# Patient Record
Sex: Male | Born: 1950 | Race: Black or African American | Hispanic: No | Marital: Single | Smoking: Former smoker
Health system: Southern US, Community
[De-identification: ages and names within clinical notes are randomized; demographics above are authoritative.]

## PROBLEM LIST (undated history)

## (undated) DIAGNOSIS — I10 Essential (primary) hypertension: Secondary | ICD-10-CM

## (undated) DIAGNOSIS — C801 Malignant (primary) neoplasm, unspecified: Secondary | ICD-10-CM

## (undated) HISTORY — PX: NO PAST SURGERIES: SHX2092

---

## 2005-11-10 ENCOUNTER — Emergency Department: Payer: Self-pay | Admitting: Emergency Medicine

## 2006-10-15 ENCOUNTER — Emergency Department: Payer: Self-pay | Admitting: General Practice

## 2008-05-30 ENCOUNTER — Emergency Department: Payer: Self-pay | Admitting: Emergency Medicine

## 2010-06-29 ENCOUNTER — Emergency Department: Payer: Self-pay | Admitting: Emergency Medicine

## 2012-03-19 ENCOUNTER — Emergency Department: Payer: Self-pay | Admitting: Emergency Medicine

## 2013-07-31 ENCOUNTER — Emergency Department: Payer: Self-pay | Admitting: Emergency Medicine

## 2013-07-31 LAB — CBC WITH DIFFERENTIAL/PLATELET
Basophil %: 0.8 %
MCH: 32.8 pg (ref 26.0–34.0)
MCHC: 34.4 g/dL (ref 32.0–36.0)
MCV: 95 fL (ref 80–100)
Monocyte #: 0.1 x10 3/mm — ABNORMAL LOW (ref 0.2–1.0)
Neutrophil #: 6.4 10*3/uL (ref 1.4–6.5)
RBC: 4.39 10*6/uL — ABNORMAL LOW (ref 4.40–5.90)
RDW: 13.6 % (ref 11.5–14.5)
WBC: 7.3 10*3/uL (ref 3.8–10.6)

## 2013-07-31 LAB — COMPREHENSIVE METABOLIC PANEL
Albumin: 4.5 g/dL (ref 3.4–5.0)
Anion Gap: 4 — ABNORMAL LOW (ref 7–16)
BUN: 17 mg/dL (ref 7–18)
Calcium, Total: 9.6 mg/dL (ref 8.5–10.1)
Chloride: 104 mmol/L (ref 98–107)
Creatinine: 0.95 mg/dL (ref 0.60–1.30)
EGFR (Non-African Amer.): >60
Osmolality: 273 (ref 275–301)
SGOT(AST): 35 U/L (ref 15–37)
SGPT (ALT): 30 U/L (ref 12–78)
Total Protein: 8.1 g/dL (ref 6.4–8.2)

## 2013-07-31 LAB — PROTIME-INR: INR: 1

## 2013-08-04 ENCOUNTER — Emergency Department: Payer: Self-pay | Admitting: Emergency Medicine

## 2014-07-21 ENCOUNTER — Emergency Department: Payer: Self-pay | Admitting: Emergency Medicine

## 2015-08-31 ENCOUNTER — Encounter: Payer: Self-pay | Admitting: Emergency Medicine

## 2015-08-31 ENCOUNTER — Emergency Department: Payer: Self-pay

## 2015-08-31 ENCOUNTER — Emergency Department
Admission: EM | Admit: 2015-08-31 | Discharge: 2015-08-31 | Disposition: A | Discharge status: Home / Self Care | Payer: Self-pay | Attending: Emergency Medicine | Admitting: Emergency Medicine

## 2015-08-31 DIAGNOSIS — I10 Essential (primary) hypertension: Secondary | ICD-10-CM | POA: Insufficient documentation

## 2015-08-31 DIAGNOSIS — Z720 Tobacco use: Secondary | ICD-10-CM | POA: Insufficient documentation

## 2015-08-31 DIAGNOSIS — R319 Hematuria, unspecified: Secondary | ICD-10-CM | POA: Insufficient documentation

## 2015-08-31 DIAGNOSIS — R109 Unspecified abdominal pain: Secondary | ICD-10-CM | POA: Insufficient documentation

## 2015-08-31 HISTORY — DX: Essential (primary) hypertension: I10

## 2015-08-31 LAB — CBC
HCT: 40.7 % (ref 40.0–52.0)
HEMOGLOBIN: 13.9 g/dL (ref 13.0–18.0)
MCH: 32.4 pg (ref 26.0–34.0)
MCHC: 34.2 g/dL (ref 32.0–36.0)
MCV: 94.7 fL (ref 80.0–100.0)
Platelets: 280 10*3/uL (ref 150–440)
RBC: 4.3 MIL/uL — ABNORMAL LOW (ref 4.40–5.90)
RDW: 14.1 % (ref 11.5–14.5)
WBC: 5.4 10*3/uL (ref 3.8–10.6)

## 2015-08-31 LAB — URINALYSIS COMPLETE WITH MICROSCOPIC (ARMC ONLY)
BACTERIA UA: NONE SEEN
Bilirubin Urine: NEGATIVE
Glucose, UA: NEGATIVE mg/dL
Leukocytes, UA: NEGATIVE
NITRITE: NEGATIVE
Protein, ur: NEGATIVE mg/dL
SPECIFIC GRAVITY, URINE: 1.023 (ref 1.005–1.030)
Squamous Epithelial / LPF: NONE SEEN
pH: 5 (ref 5.0–8.0)

## 2015-08-31 LAB — BASIC METABOLIC PANEL
ANION GAP: 9 (ref 5–15)
BUN: 16 mg/dL (ref 6–20)
CHLORIDE: 108 mmol/L (ref 101–111)
CO2: 24 mmol/L (ref 22–32)
Calcium: 9.3 mg/dL (ref 8.9–10.3)
Creatinine, Ser: 0.93 mg/dL (ref 0.61–1.24)
GFR calc Af Amer: >60 mL/min (ref >60)
GFR calc non Af Amer: >60 mL/min (ref >60)
Glucose, Bld: 112 mg/dL — ABNORMAL HIGH (ref 65–99)
Potassium: 3.6 mmol/L (ref 3.5–5.1)
Sodium: 141 mmol/L (ref 135–145)

## 2015-08-31 LAB — LIPASE, BLOOD: LIPASE: 42 U/L (ref 11–51)

## 2015-08-31 NOTE — ED Provider Notes (Signed)
[facility] Emergency Department Provider Note  ____________________________________________  Time seen: On arrival  I have reviewed the triage vital signs and the nursing notes.   HISTORY  Chief Complaint Flank Pain    HPI Willie Rivas is a 64 y.o. male who presents with complaints of cramping right flank pain for approximately 2 days. He states the pain seems to be intermittent but is at times moderate. Currently it is mild. He denies dysuria. No fevers or chills. No penile discharge. No testicular pain. No history of the same     Past Medical History  Diagnosis Date  . Hypertension     There are no active problems to display for this patient.   History reviewed. No pertinent past surgical history.  No current outpatient prescriptions on file.  Allergies Review of patient's allergies indicates no known allergies.  No family history on file.  Social History Social History  Substance Use Topics  . Smoking status: Current Every Day Smoker  . Smokeless tobacco: None  . Alcohol Use: Yes    Review of Systems  Constitutional: Negative for fever. Eyes: Negative for visual changes. ENT: Negative for sore throat Cardiovascular: Negative for chest pain. Respiratory: Negative for shortness of breath. Gastrointestinal: Negative for abdominal pain, vomiting and diarrhea. Positive for flank pain Genitourinary: Negative for dysuria. Musculoskeletal: Negative for back pain. Skin: Negative for rash. Neurological: Negative for headaches or focal weakness Psychiatric: No anxiety    ____________________________________________   PHYSICAL EXAM:  VITAL SIGNS: ED Triage Vitals  Enc Vitals Group     BP 08/31/15 1757 196/112 mmHg     Pulse Rate 08/31/15 1757 79     Resp 08/31/15 1757 18     Temp 08/31/15 1757 98.4 F (36.9 C)     Temp Source 08/31/15 1757 Oral     SpO2 08/31/15 1757 96 %     Weight 08/31/15 1757 165 lb (74.844 kg)   Height 08/31/15 1757 5\' 6"  (1.676 m)     Head Cir --      Peak Flow --      Pain Score 08/31/15 1758 3     Pain Loc --      Pain Edu? --      Excl. in [place]? --      Constitutional: Alert and oriented. Well appearing and in no distress. Eyes: Conjunctivae are normal.  ENT   Head: Normocephalic and atraumatic.   Mouth/Throat: Mucous membranes are moist. Cardiovascular: Normal rate, regular rhythm. Normal and symmetric distal pulses are present in all extremities. No murmurs, rubs, or gallops. Respiratory: Normal respiratory effort without tachypnea nor retractions. Breath sounds are clear and equal bilaterally.  Gastrointestinal: Soft and non-tender in all quadrants. No distention. There is no CVA tenderness. Genitourinary: deferred Musculoskeletal: Nontender with normal range of motion in all extremities. No lower extremity tenderness nor edema. Neurologic:  Normal speech and language. No gross focal neurologic deficits are appreciated. Skin:  Skin is warm, dry and intact. No rash noted. Psychiatric: Mood and affect are normal. Patient exhibits appropriate insight and judgment.  ____________________________________________    LABS (pertinent positives/negatives)  Labs Reviewed  URINALYSIS COMPLETEWITH MICROSCOPIC ([place] ONLY) - Abnormal; Notable for the following:    Color, Urine YELLOW (*)    APPearance CLEAR (*)    Ketones, ur TRACE (*)    Hgb urine dipstick 1+ (*)    All other components within normal limits  BASIC METABOLIC PANEL - Abnormal; Notable for the following:  Glucose, Bld 112 (*)    All other components within normal limits  CBC - Abnormal; Notable for the following:    RBC 4.30 (*)    All other components within normal limits  LIPASE, BLOOD    ____________________________________________   EKG  None  ____________________________________________    RADIOLOGY I have personally reviewed any xrays that were ordered on this patient: CT renal  stone study shows no abnormalities  ____________________________________________   PROCEDURES  Procedure(s) performed: none  Critical Care performed: none  ____________________________________________   INITIAL IMPRESSION / ASSESSMENT AND PLAN / ED COURSE  Pertinent labs & imaging results that were available during my care of the patient were reviewed by me and considered in my medical decision making (see chart for details).  Patient overall well-appearing and quite comfortable. We will check urine and labs to evaluate for possible kidney stone versus UTI versus other pathology  Urinalysis shows blood in the urine, I will obtain CT renal stone study to evaluate for possible stone.  ----------------------------------------- 7:49 PM on 08/31/2015 -----------------------------------------  CT negative. I discussed with the patient that he has small amount of blood in his urine requires further follow-up. He knows to return if his pain worsens or if he has any concerns ____________________________________________   FINAL CLINICAL IMPRESSION(S) / ED DIAGNOSES  Final diagnoses:  Flank pain, acute     Lavonia Drafts, MD 08/31/15 1950

## 2015-08-31 NOTE — ED Notes (Signed)
C/o right flank pain x 2 days, denies any urinary symptoms or n,v

## 2015-08-31 NOTE — Discharge Instructions (Signed)
Hematuria, Adult Hematuria is blood in your urine. It can be caused by a bladder infection, kidney infection, prostate infection, kidney stone, or cancer of your urinary tract. Infections can usually be treated with medicine, and a kidney stone usually will pass through your urine. If neither of these is the cause of your hematuria, further workup to find out the reason may be needed. It is very important that you tell your health care provider about any blood you see in your urine, even if the blood stops without treatment or happens without causing pain. Blood in your urine that happens and then stops and then happens again can be a symptom of a very serious condition. Also, pain is not a symptom in the initial stages of many urinary cancers. HOME CARE INSTRUCTIONS   Drink lots of fluid, 3-4 quarts a day. If you have been diagnosed with an infection, cranberry juice is especially recommended, in addition to large amounts of water.  Avoid caffeine, tea, and carbonated beverages because they tend to irritate the bladder.  Avoid alcohol because it may irritate the prostate.  Take all medicines as directed by your health care provider.  If you were prescribed an antibiotic medicine, finish it all even if you start to feel better.  If you have been diagnosed with a kidney stone, follow your health care provider's instructions regarding straining your urine to catch the stone.  Empty your bladder often. Avoid holding urine for long periods of time.  After a bowel movement, women should cleanse front to back. Use each tissue only once.  Empty your bladder before and after sexual intercourse if you are a male. SEEK MEDICAL CARE IF:  You develop back pain.  You have a fever.  You have a feeling of sickness in your stomach (nausea) or vomiting.  Your symptoms are not better in 3 days. Return sooner if you are getting worse. SEEK IMMEDIATE MEDICAL CARE IF:   You develop severe vomiting and  are unable to keep the medicine down.  You develop severe back or abdominal pain despite taking your medicines.  You begin passing a large amount of blood or clots in your urine.  You feel extremely weak or faint, or you pass out. MAKE SURE YOU:   Understand these instructions.  Will watch your condition.  Will get help right away if you are not doing well or get worse.   This information is not intended to replace advice given to you by your health care provider. Make sure you discuss any questions you have with your health care provider.   Document Released: 10/18/2005 Document Revised: 11/08/2014 Document Reviewed: 06/18/2013 Elsevier Interactive Patient Education 2016 Elsevier Inc[employer]  Flank Pain Flank pain refers to pain that is located on the side of the body between the upper abdomen and the back. The pain may occur over a short period of time (acute) or may be long-term or reoccurring (chronic). It may be mild or severe. Flank pain can be caused by many things. CAUSES  Some of the more common causes of flank pain include:  Muscle strains.   Muscle spasms.   A disease of your spine (vertebral disk disease).   A lung infection (pneumonia).   Fluid around your lungs (pulmonary edema).   A kidney infection.   Kidney stones.   A very painful skin rash caused by the chickenpox virus (shingles).   Gallbladder disease.  Advance care will depend on the cause of your  pain. In general,  Rest as directed by your caregiver.  Drink enough fluids to keep your urine clear or pale yellow.  Only take over-the-counter or prescription medicines as directed by your caregiver. Some medicines may help relieve the pain.  Tell your caregiver about any changes in your pain.  Follow up with your caregiver as directed. SEEK IMMEDIATE MEDICAL CARE IF:   Your pain is not controlled with medicine.   You have new or worsening symptoms.  Your pain  increases.   You have abdominal pain.   You have shortness of breath.   You have persistent nausea or vomiting.   You have swelling in your abdomen.   You feel faint or pass out.   You have blood in your urine.  You have a fever or persistent symptoms for more than 2-3 days.  You have a fever and your symptoms suddenly get worse. MAKE SURE YOU:   Understand these instructions.  Will watch your condition.  Will get help right away if you are not doing well or get worse.   This information is not intended to replace advice given to you by your health care provider. Make sure you discuss any questions you have with your health care provider.   Document Released: 12/09/2005 Document Revised: 07/12/2012 Document Reviewed: 06/01/2012 Elsevier Interactive Patient Education [employer].

## 2015-09-06 ENCOUNTER — Emergency Department
Admission: EM | Admit: 2015-09-06 | Discharge: 2015-09-07 | Disposition: A | Discharge status: Home / Self Care | Payer: Self-pay | Attending: Emergency Medicine | Admitting: Emergency Medicine

## 2015-09-06 ENCOUNTER — Encounter: Payer: Self-pay | Admitting: Emergency Medicine

## 2015-09-06 DIAGNOSIS — I10 Essential (primary) hypertension: Secondary | ICD-10-CM | POA: Insufficient documentation

## 2015-09-06 DIAGNOSIS — Z720 Tobacco use: Secondary | ICD-10-CM | POA: Insufficient documentation

## 2015-09-06 DIAGNOSIS — R109 Unspecified abdominal pain: Secondary | ICD-10-CM | POA: Insufficient documentation

## 2015-09-06 DIAGNOSIS — R319 Hematuria, unspecified: Secondary | ICD-10-CM | POA: Insufficient documentation

## 2015-09-06 LAB — CBC
HCT: 41.7 % (ref 40.0–52.0)
Hemoglobin: 13.8 g/dL (ref 13.0–18.0)
MCH: 31.7 pg (ref 26.0–34.0)
MCHC: 33 g/dL (ref 32.0–36.0)
MCV: 95.9 fL (ref 80.0–100.0)
PLATELETS: 278 10*3/uL (ref 150–440)
RBC: 4.34 MIL/uL — AB (ref 4.40–5.90)
RDW: 14 % (ref 11.5–14.5)
WBC: 6.6 10*3/uL (ref 3.8–10.6)

## 2015-09-06 LAB — URINALYSIS COMPLETE WITH MICROSCOPIC (ARMC ONLY)
BACTERIA UA: NONE SEEN
Bilirubin Urine: NEGATIVE
GLUCOSE, UA: NEGATIVE mg/dL
Ketones, ur: NEGATIVE mg/dL
Leukocytes, UA: NEGATIVE
Nitrite: NEGATIVE
PROTEIN: NEGATIVE mg/dL
Specific Gravity, Urine: 1.027 (ref 1.005–1.030)
pH: 5 (ref 5.0–8.0)

## 2015-09-06 LAB — BASIC METABOLIC PANEL
Anion gap: 4 — ABNORMAL LOW (ref 5–15)
BUN: 14 mg/dL (ref 6–20)
CHLORIDE: 106 mmol/L (ref 101–111)
CO2: 30 mmol/L (ref 22–32)
CREATININE: 1.03 mg/dL (ref 0.61–1.24)
Calcium: 9.7 mg/dL (ref 8.9–10.3)
Glucose, Bld: 121 mg/dL — ABNORMAL HIGH (ref 65–99)
POTASSIUM: 4 mmol/L (ref 3.5–5.1)
SODIUM: 140 mmol/L (ref 135–145)

## 2015-09-06 MED ORDER — SODIUM CHLORIDE 0.9 % IV BOLUS (SEPSIS): 500.0000 mL | INTRAVENOUS | Status: DC

## 2015-09-06 NOTE — ED Provider Notes (Signed)
[facility] Emergency Department Provider Note  ____________________________________________  Time seen: Approximately 11:20 PM  I have reviewed the triage vital signs and the nursing notes.   HISTORY  Chief Complaint Flank Pain    HPI Willie Rivas is a 64 y.o. male with a past medical history that includes hypertension and tobacco abuse who presents with persistent right-sided flank pain that has been going on for about 10 days.  He was seen approximately a week ago after 2-3 days of pain and had an essentially normal workup with microscopic hematuria and a negative CT renal stone study.  However, he states that his pain has been persistent and some cases has been worsening over the last week.  He has not yet followed up with urology.  He describes the pain as sharp and stabbing and severe in his right flank.  It comes and goes and waxes and wanes in intensity from mild to severe.  Nothing in particular makes it better and nothing specifically makes it worse.  He does not see any blood in his urine but it is present on urinalysis.  He denies fever/chills, chest pain, shortness of breath, abdominal pain, dysuria.   Past Medical History  Diagnosis Date  . Hypertension     There are no active problems to display for this patient.   History reviewed. No pertinent past surgical history.  Current Outpatient Rx  Name  Route  Sig  Dispense  Refill  . HYDROcodone-acetaminophen (NORCO/VICODIN) 5-325 MG tablet   Oral   Take 1-2 tablets by mouth every 4 (four) hours as needed for moderate pain.   15 tablet   0     Allergies Review of patient's allergies indicates no known allergies.  History reviewed. No pertinent family history.  Social History Social History  Substance Use Topics  . Smoking status: Current Every Day Smoker  . Smokeless tobacco: Never Used  . Alcohol Use: Yes    Review of Systems Constitutional: No fever/chills Eyes: No visual  changes. ENT: No sore throat. Cardiovascular: Denies chest pain. Respiratory: Denies shortness of breath. Gastrointestinal: No abdominal pain.  No nausea, no vomiting.  No diarrhea.  No constipation. Genitourinary: Negative for dysuria.  Negative for gross hematuria. negative for testicular pain Musculoskeletal: Waxing and waning intermittent right flank pain Skin: Negative for rash. Neurological: Negative for headaches, focal weakness or numbness.  10-point ROS otherwise negative.  ____________________________________________   PHYSICAL EXAM:  VITAL SIGNS: ED Triage Vitals  Enc Vitals Group     BP 09/06/15 2103 160/91 mmHg     Pulse Rate 09/06/15 2103 72     Resp 09/06/15 2103 16     Temp 09/06/15 2103 98.7 F (37.1 C)     Temp Source 09/06/15 2103 Oral     SpO2 09/06/15 2103 96 %     Weight 09/06/15 2103 165 lb (74.844 kg)     Height 09/06/15 2103 5\' 6"  (1.676 m)     Head Cir --      Peak Flow --      Pain Score 09/06/15 2103 5     Pain Loc --      Pain Edu? --      Excl. in [place]? --     Constitutional: Alert and oriented. Well appearing and in no acute distress. Eyes: Conjunctivae are normal. PERRL. EOMI. Head: Atraumatic. Nose: No congestion/rhinnorhea. Mouth/Throat: Mucous membranes are moist.  Oropharynx non-erythematous. Neck: No stridor.   Cardiovascular: Normal rate, regular rhythm. Grossly  normal heart sounds.  Good peripheral circulation. Respiratory: Normal respiratory effort.  No retractions. Lungs CTAB. Gastrointestinal: Soft and nontender. No distention. No abdominal bruits. No CVA tenderness. Musculoskeletal: No lower extremity tenderness nor edema.  No joint effusions. Neurologic:  Normal speech and language. No gross focal neurologic deficits are appreciated.  Skin:  Skin is warm, dry and intact. No rash noted. Psychiatric: Mood and affect are normal. Speech and behavior are normal.  ____________________________________________   LABS (all labs  ordered are listed, but only abnormal results are displayed)  Labs Reviewed  URINALYSIS COMPLETEWITH MICROSCOPIC ([facility] ONLY) - Abnormal; Notable for the following:    Color, Urine YELLOW (*)    APPearance CLEAR (*)    Hgb urine dipstick 2+ (*)    Squamous Epithelial / LPF 0-5 (*)    All other components within normal limits  BASIC METABOLIC PANEL - Abnormal; Notable for the following:    Glucose, Bld 121 (*)    Anion gap 4 (*)    All other components within normal limits  CBC - Abnormal; Notable for the following:    RBC 4.34 (*)    All other components within normal limits   ____________________________________________  EKG  Not indicated ____________________________________________  RADIOLOGY   No results found.  ____________________________________________   PROCEDURES  Procedure(s) performed: None  Critical Care performed: No ____________________________________________   INITIAL IMPRESSION / ASSESSMENT AND PLAN / ED COURSE  Pertinent labs & imaging results that were available during my care of the patient were reviewed by me and considered in my medical decision making (see chart for details).  The patient has more microscopic hematuria that he did the last time but his creatinine remains normal.  There is no sign of infection in his urinalysis.  Given his negative unenhanced CT scan but persistent pain, I believe I should investigate further although he likely will need urological follow-up.  I discussed the case with the on-call radiologist to discuss the appropriate imaging modality, CT urogram versus CTA for possible arterial occlusion, etc.  His feeling was that I would obtain all the information I needed the emergency department setting with an IV contrast enhanced CT scan of the abdomen and pelvis.  Any additional workup can be deferred to urology.  I agree with assessment and will proceed with a IV contrast only CT scan of the abdomen and  pelvis.  ----------------------------------------- 11:53 PM on 09/06/2015 -----------------------------------------  The patient is insistent that he does not want a CT scan.  I had an extensive conversation with him and explained that we are giving a different type of scan this time and that it would give [place] much better luck in a more detailed look at his kidney and various things that could be causing his pain and hematuria.  He continues to refuse and states "I just want some pain medicine and antibiotics."  I explained that antibiotics are not helping him as he has no sign of infection.  He continues to refuse the CT scan.  I stressed the importance of follow-up with urology and explained that he needs to have further evaluation to determine if he has cancer which is causing his pain and his blood in the urine.  He understands and agrees to follow-up.  I will give him a short course of Norco and will against stress and the written instructions that it is imperative he follow-up as soon as possible.  I gave him my usual and customary return precautions. ____________________________________________  FINAL CLINICAL  IMPRESSION(S) / ED DIAGNOSES  Final diagnoses:  Right flank pain  Hematuria      NEW MEDICATIONS STARTED DURING THIS VISIT:  New Prescriptions   HYDROCODONE-ACETAMINOPHEN (NORCO/VICODIN) 5-325 MG TABLET    Take 1-2 tablets by mouth every 4 (four) hours as needed for moderate pain.     Hinda Kehr, MD 09/07/15 [phone]

## 2015-09-06 NOTE — ED Notes (Addendum)
Pt refuses CT , Dr. Karma Greaser at bedside.

## 2015-09-06 NOTE — ED Notes (Signed)
Pt states has had right flank pain for over one week. Pt states "they couldn't find anything wrong with me when i was here last week and told me to take motrin, but that don't help." pt denies known hematuria, dysuria, fever. Pt states "i threw up today around one though". Pt denies nausea currently.

## 2015-09-06 NOTE — ED Notes (Signed)
RN entered room to insert IV and give fluids, pt refusing, stating "i just had that done last week I don't need it again" MD Karma Greaser made aware, entering room at this time

## 2015-09-06 NOTE — ED Notes (Signed)
MD at bedside. 

## 2015-09-07 MED ORDER — HYDROCODONE-ACETAMINOPHEN 5-325 MG PO TABS: 2.0000 | ORAL_TABLET | Freq: Once | ORAL | Status: DC

## 2015-09-07 MED ORDER — HYDROCODONE-ACETAMINOPHEN 5-325 MG PO TABS: 1.0000 | ORAL_TABLET | ORAL | Status: DC | PRN

## 2015-09-07 NOTE — Discharge Instructions (Signed)
As we discussed, we did not identify a specific cause of your pain and blood in your urine.  We offered to get a different kind of CT scan of your abdomen and pelvis for a better look at your kidney, but you are refusing it at this time.  It is extremely important that you follow up with a urologist as recommended given that we do not have a specific diagnosis, and they need to help you investigate other possibilities, such as cancer (even though that may be unlikely).  Return to the Emergency Department if you develop new or worsening symptoms that concern you.   Flank Pain Flank pain refers to pain that is located on the side of the body between the upper abdomen and the back. The pain may occur over a short period of time (acute) or may be long-term or reoccurring (chronic). It may be mild or severe. Flank pain can be caused by many things. CAUSES  Some of the more common causes of flank pain include:  Muscle strains.   Muscle spasms.   A disease of your spine (vertebral disk disease).   A lung infection (pneumonia).   Fluid around your lungs (pulmonary edema).   A kidney infection.   Kidney stones.   A very painful skin rash caused by the chickenpox virus (shingles).   Gallbladder disease.  [place] care will depend on the cause of your pain. In general,  Rest as directed by your caregiver.  Drink enough fluids to keep your urine clear or pale yellow.  Only take over-the-counter or prescription medicines as directed by your caregiver. Some medicines may help relieve the pain.  Tell your caregiver about any changes in your pain.  Follow up with your caregiver as directed. SEEK IMMEDIATE MEDICAL CARE IF:   Your pain is not controlled with medicine.   You have new or worsening symptoms.  Your pain increases.   You have abdominal pain.   You have shortness of breath.   You have persistent nausea or vomiting.   You have swelling in  your abdomen.   You feel faint or pass out.   You have blood in your urine.  You have a fever or persistent symptoms for more than 2-3 days.  You have a fever and your symptoms suddenly get worse. MAKE SURE YOU:   Understand these instructions.  Will watch your condition.  Will get help right away if you are not doing well or get worse.   This information is not intended to replace advice given to you by your health care provider. Make sure you discuss any questions you have with your health care provider.   Document Released: 12/09/2005 Document Revised: 07/12/2012 Document Reviewed: 06/01/2012 Elsevier Interactive Patient Education 2016 Elsevier Inc.  Hematuria, Adult Hematuria is blood in your urine. It can be caused by a bladder infection, kidney infection, prostate infection, kidney stone, or cancer of your urinary tract. Infections can usually be treated with medicine, and a kidney stone usually will pass through your urine. If neither of these is the cause of your hematuria, further workup to find out the reason may be needed. It is very important that you tell your health care provider about any blood you see in your urine, even if the blood stops without treatment or happens without causing pain. Blood in your urine that happens and then stops and then happens again can be a symptom of a very serious condition. Also, pain is  not a symptom in the initial stages of many urinary cancers. HOME CARE INSTRUCTIONS   Drink lots of fluid, 3-4 quarts a day. If you have been diagnosed with an infection, cranberry juice is especially recommended, in addition to large amounts of water.  Avoid caffeine, tea, and carbonated beverages because they tend to irritate the bladder.  Avoid alcohol because it may irritate the prostate.  Take all medicines as directed by your health care provider.  If you were prescribed an antibiotic medicine, finish it all even if you start to feel  better.  If you have been diagnosed with a kidney stone, follow your health care provider's instructions regarding straining your urine to catch the stone.  Empty your bladder often. Avoid holding urine for long periods of time.  After a bowel movement, women should cleanse front to back. Use each tissue only once.  Empty your bladder before and after sexual intercourse if you are a male. SEEK MEDICAL CARE IF:  You develop back pain.  You have a fever.  You have a feeling of sickness in your stomach (nausea) or vomiting.  Your symptoms are not better in 3 days. Return sooner if you are getting worse. SEEK IMMEDIATE MEDICAL CARE IF:   You develop severe vomiting and are unable to keep the medicine down.  You develop severe back or abdominal pain despite taking your medicines.  You begin passing a large amount of blood or clots in your urine.  You feel extremely weak or faint, or you pass out. MAKE SURE YOU:   Understand these instructions.  Will watch your condition.  Will get help right away if you are not doing well or get worse.   This information is not intended to replace advice given to you by your health care provider. Make sure you discuss any questions you have with your health care provider.   Document Released: 10/18/2005 Document Revised: 11/08/2014 Document Reviewed: 06/18/2013 Elsevier Interactive Patient Education [employer].

## 2016-09-08 ENCOUNTER — Encounter: Payer: Self-pay | Admitting: Emergency Medicine

## 2016-09-08 ENCOUNTER — Emergency Department
Admission: EM | Admit: 2016-09-08 | Discharge: 2016-09-09 | Disposition: A | Discharge status: Home / Self Care | Payer: Self-pay | Attending: Emergency Medicine | Admitting: Emergency Medicine

## 2016-09-08 ENCOUNTER — Emergency Department: Payer: Self-pay

## 2016-09-08 DIAGNOSIS — F17200 Nicotine dependence, unspecified, uncomplicated: Secondary | ICD-10-CM | POA: Insufficient documentation

## 2016-09-08 DIAGNOSIS — R110 Nausea: Secondary | ICD-10-CM

## 2016-09-08 DIAGNOSIS — K529 Noninfective gastroenteritis and colitis, unspecified: Secondary | ICD-10-CM | POA: Insufficient documentation

## 2016-09-08 DIAGNOSIS — I10 Essential (primary) hypertension: Secondary | ICD-10-CM | POA: Insufficient documentation

## 2016-09-08 DIAGNOSIS — R1032 Left lower quadrant pain: Secondary | ICD-10-CM

## 2016-09-08 LAB — COMPREHENSIVE METABOLIC PANEL
ALBUMIN: 4 g/dL (ref 3.5–5.0)
ALK PHOS: 65 U/L (ref 38–126)
ALT: 18 U/L (ref 17–63)
ANION GAP: 10 (ref 5–15)
AST: 29 U/L (ref 15–41)
BILIRUBIN TOTAL: 1 mg/dL (ref 0.3–1.2)
BUN: 25 mg/dL — ABNORMAL HIGH (ref 6–20)
CALCIUM: 9.4 mg/dL (ref 8.9–10.3)
CO2: 27 mmol/L (ref 22–32)
CREATININE: 1.48 mg/dL — AB (ref 0.61–1.24)
Chloride: 99 mmol/L — ABNORMAL LOW (ref 101–111)
GFR calc Af Amer: 56 mL/min — ABNORMAL LOW (ref >60)
GFR calc non Af Amer: 48 mL/min — ABNORMAL LOW (ref >60)
GLUCOSE: 117 mg/dL — AB (ref 65–99)
Potassium: 4.3 mmol/L (ref 3.5–5.1)
SODIUM: 136 mmol/L (ref 135–145)
TOTAL PROTEIN: 8.1 g/dL (ref 6.5–8.1)

## 2016-09-08 LAB — CBC
HCT: 42.4 % (ref 40.0–52.0)
HEMOGLOBIN: 14.4 g/dL (ref 13.0–18.0)
MCH: 31.5 pg (ref 26.0–34.0)
MCHC: 34 g/dL (ref 32.0–36.0)
MCV: 92.7 fL (ref 80.0–100.0)
Platelets: 266 10*3/uL (ref 150–440)
RBC: 4.57 MIL/uL (ref 4.40–5.90)
RDW: 14 % (ref 11.5–14.5)
WBC: 14.4 10*3/uL — ABNORMAL HIGH (ref 3.8–10.6)

## 2016-09-08 LAB — LIPASE, BLOOD: Lipase: 15 U/L (ref 11–51)

## 2016-09-08 MED ORDER — ONDANSETRON HCL 4 MG/2ML IJ SOLN
4.0000 mg | Freq: Once | INTRAMUSCULAR | Status: AC
  Administered 2016-09-08: 4 mg via INTRAVENOUS
  Filled 2016-09-08: qty 2

## 2016-09-08 MED ORDER — SODIUM CHLORIDE 0.9 % IV SOLN
1000.0000 mL | Freq: Once | INTRAVENOUS | Status: AC
  Administered 2016-09-08: 1000 mL via INTRAVENOUS

## 2016-09-08 MED ORDER — IOPAMIDOL (ISOVUE-300) INJECTION 61%
100.0000 mL | Freq: Once | INTRAVENOUS | Status: AC | PRN
Start: 2016-09-08 — End: 2016-09-08
  Administered 2016-09-08: 100 mL via INTRAVENOUS

## 2016-09-08 MED ORDER — IOPAMIDOL (ISOVUE-300) INJECTION 61%
30.0000 mL | Freq: Once | INTRAVENOUS | Status: AC | PRN
  Administered 2016-09-08: 30 mL via ORAL

## 2016-09-08 NOTE — ED Provider Notes (Signed)
[facility] Emergency Department Provider Note   ____________________________________________    I have reviewed the triage vital signs and the nursing notes.   HISTORY  Chief Complaint Abdominal Pain     HPI Willie Rivas is a 65 y.o. male Who presents with complaints of nausea and mild abdominal discomfort. Patient reports he has had mild aching in his lower abdomen over the last 2 days, but his primary complaint is feeling nauseous today and is not able to tolerate by mouth. He denies fevers or chills. No sick contacts, no recent travel.   Past Medical History:  Diagnosis Date  . Hypertension     There are no active problems to display for this patient.   History reviewed. No pertinent surgical history.  Prior to Admission medications   Medication Sig Start Date End Date Taking? Authorizing Provider  HYDROcodone-acetaminophen (NORCO/VICODIN) 5-325 MG tablet Take 1-2 tablets by mouth every 4 (four) hours as needed for moderate pain. 09/07/15   Hinda Kehr, MD     Allergies Patient has no known allergies.  No family history on file.  Social History Social History  Substance Use Topics  . Smoking status: Current Every Day Smoker  . Smokeless tobacco: Never Used  . Alcohol use No    Review of Systems  Constitutional: No fever/chills  ENT: No sore throat. Cardiovascular: Denies chest pain. Respiratory: Denies shortness of breath. Gastrointestinal: as above  Genitourinary: Negative for dysuria. Musculoskeletal: Negative for back pain. Skin: Negative for rash. Neurological: Negative for headaches or weakness  10-point ROS otherwise negative.  ____________________________________________   PHYSICAL EXAM:  VITAL SIGNS: ED Triage Vitals  Enc Vitals Group     BP 09/08/16 2206 (!) 154/90     Pulse Rate 09/08/16 2206 (!) 103     Resp 09/08/16 2206 18     Temp 09/08/16 2206 97.9 F (36.6 C)     Temp Source 09/08/16 2206 Oral      SpO2 09/08/16 2206 95 %     Weight 09/08/16 2204 165 lb (74.8 kg)     Height 09/08/16 2204 5\' 6"  (1.676 m)     Head Circumference --      Peak Flow --      Pain Score 09/08/16 2205 3     Pain Loc --      Pain Edu? --      Excl. in [place]? --     Constitutional: Alert and oriented. No acute distress. Pleasant and interactive Eyes: Conjunctivae are normal.   Nose: No congestion/rhinnorhea. Mouth/Throat: Mucous membranes are moist.    Cardiovascular: Normal rate, regular rhythm. Grossly normal heart sounds.  Good peripheral circulation. Respiratory: Normal respiratory effort.  No retractions. Lungs CTAB. Gastrointestinal: Soft and nontender. No distention.  No CVA tenderness. Genitourinary: deferred Musculoskeletal: No lower extremity tenderness nor edema.  Warm and well perfused Neurologic:  Normal speech and language. No gross focal neurologic deficits are appreciated.  Skin:  Skin is warm, dry and intact. No rash noted. Psychiatric: Mood and affect are normal. Speech and behavior are normal.  ____________________________________________   LABS (all labs ordered are listed, but only abnormal results are displayed)  Labs Reviewed  CBC - Abnormal; Notable for the following:       Result Value   WBC 14.4 (*)    All other components within normal limits  COMPREHENSIVE METABOLIC PANEL  LIPASE, BLOOD  URINALYSIS COMPLETEWITH MICROSCOPIC ([facility] ONLY)   ____________________________________________  EKG  None ____________________________________________  RADIOLOGY  None ____________________________________________   PROCEDURES  Procedure(s) performed: No    Critical Care performed:No ____________________________________________   INITIAL IMPRESSION / ASSESSMENT AND PLAN / ED COURSE  Pertinent labs & imaging results that were available during my care of the patient were reviewed by me and considered in my medical decision making (see chart for  details).  Patient well-appearing and in no distress. His exam is mostly benign. He has mild abdominal tenderness to palpation. We will check labs, give IV Zofran and reevaluate  Clinical Course   WBC elevated, given abd ttp we will obtain ct abd pelv. Have asked Dr. Beather Arbour to follow up on CT ____________________________________________   FINAL CLINICAL IMPRESSION(S) / ED DIAGNOSES  Final diagnoses:  Left lower quadrant pain      NEW MEDICATIONS STARTED DURING THIS VISIT:  New Prescriptions   No medications on file     Note:  This document was prepared using Dragon voice recognition software and may include unintentional dictation errors.    Lavonia Drafts, MD 09/08/16 [phone]

## 2016-09-08 NOTE — ED Triage Notes (Signed)
Patient ambulatory to triage with steady gait, without difficulty or distress noted; pt reports N/V/D, lower abd pain since Sunday after eating broccoli

## 2016-09-09 MED ORDER — CIPROFLOXACIN HCL 500 MG PO TABS
500.0000 mg | ORAL_TABLET | Freq: Once | ORAL | Status: AC
  Administered 2016-09-09: 500 mg via ORAL
  Filled 2016-09-09: qty 1

## 2016-09-09 MED ORDER — METRONIDAZOLE 500 MG PO TABS: 500.0000 mg | ORAL_TABLET | Freq: Two times a day (BID) | ORAL | 0 refills | Status: DC

## 2016-09-09 MED ORDER — CIPROFLOXACIN HCL 500 MG PO TABS: 500.0000 mg | ORAL_TABLET | Freq: Two times a day (BID) | ORAL | 0 refills | Status: DC

## 2016-09-09 MED ORDER — METRONIDAZOLE 500 MG PO TABS
500.0000 mg | ORAL_TABLET | Freq: Once | ORAL | Status: AC
  Administered 2016-09-09: 500 mg via ORAL
  Filled 2016-09-09: qty 1

## 2016-09-09 MED ORDER — HYDROCODONE-ACETAMINOPHEN 5-325 MG PO TABS: 1.0000 | ORAL_TABLET | Freq: Four times a day (QID) | ORAL | 0 refills | Status: DC | PRN

## 2016-09-09 MED ORDER — ONDANSETRON 4 MG PO TBDP: 4.0000 mg | ORAL_TABLET | Freq: Three times a day (TID) | ORAL | 0 refills | Status: DC | PRN

## 2016-09-09 NOTE — Discharge Instructions (Signed)
1. Take antibiotics as prescribed: Cipro 500 mg twice daily 7 days Flagyl 500 mg twice daily 7 days 2. You may take pain and nausea medicines as needed (Norco/Zofran #15). 3. Eat a bland diet for the next 5 days, then slowly advance diet as tolerated. Avoid heavy, greasy, spicy foods and alcohol. 4. Return to the ER for worsening symptoms, persistent vomiting, difficulty breathing, fever, or other concerns.

## 2016-09-09 NOTE — ED Provider Notes (Signed)
-----------------------------------------   12:57 AM on 09/09/2016 -----------------------------------------  CT abdomen/pelvis interpreted per Dr. Randel Pigg: Abnormal small and large bowel thickening and distention suggesting  moderate to severe enterocolitis. Hazy mesenteric edema in the mid  pelvis is noted with a few dots of rounded air leg lucencies  possibly representing diverticula. Extraluminal gas believed less  likely however, the enteric contrast ingested has not reached this  area for further assessment. Repeat delayed imaging in the a.m. may  prove helpful.   Awakened patient from sleep to update him of CT imaging results. Confirmed that he has been afebrile with nausea only, no vomiting. Examined abdomen which is nontender to palpation. Given patient's benign presentation, clinically I do not believe he would benefit from repeat imaging. Will treat with Cipro and Flagyl, and patient will follow-up with his PCP closely. Strict return precautions given. Patient verbalizes understanding and agrees with plan of care.   Paulette Blanch, MD 09/09/16 [phone]

## 2016-09-16 ENCOUNTER — Inpatient Hospital Stay
Admission: EM | Admit: 2016-09-16 | Discharge: 2016-09-20 | DRG: 386 | Discharge status: Against Medical Advice | Payer: Self-pay | Attending: Internal Medicine | Admitting: Internal Medicine

## 2016-09-16 ENCOUNTER — Encounter: Payer: Self-pay | Admitting: Emergency Medicine

## 2016-09-16 ENCOUNTER — Emergency Department: Payer: Self-pay

## 2016-09-16 DIAGNOSIS — Z8249 Family history of ischemic heart disease and other diseases of the circulatory system: Secondary | ICD-10-CM

## 2016-09-16 DIAGNOSIS — K50012 Crohn's disease of small intestine with intestinal obstruction: Secondary | ICD-10-CM | POA: Diagnosis present

## 2016-09-16 DIAGNOSIS — Z87891 Personal history of nicotine dependence: Secondary | ICD-10-CM

## 2016-09-16 DIAGNOSIS — I10 Essential (primary) hypertension: Secondary | ICD-10-CM | POA: Diagnosis present

## 2016-09-16 DIAGNOSIS — K3533 Acute appendicitis with perforation and localized peritonitis, with abscess: Secondary | ICD-10-CM

## 2016-09-16 DIAGNOSIS — K56609 Unspecified intestinal obstruction, unspecified as to partial versus complete obstruction: Secondary | ICD-10-CM | POA: Diagnosis present

## 2016-09-16 DIAGNOSIS — K50014 Crohn's disease of small intestine with abscess: Principal | ICD-10-CM | POA: Diagnosis present

## 2016-09-16 LAB — URINALYSIS COMPLETE WITH MICROSCOPIC (ARMC ONLY)
BACTERIA UA: NONE SEEN
BILIRUBIN URINE: NEGATIVE
Cellular Cast, UA: 3
GLUCOSE, UA: NEGATIVE mg/dL
HGB URINE DIPSTICK: NEGATIVE
NITRITE: NEGATIVE
PH: 5 (ref 5.0–8.0)
Protein, ur: 30 mg/dL — AB
SPECIFIC GRAVITY, URINE: 1.026 (ref 1.005–1.030)

## 2016-09-16 LAB — COMPREHENSIVE METABOLIC PANEL
ALBUMIN: 4 g/dL (ref 3.5–5.0)
ALK PHOS: 54 U/L (ref 38–126)
ALT: 30 U/L (ref 17–63)
AST: 24 U/L (ref 15–41)
Anion gap: 10 (ref 5–15)
BILIRUBIN TOTAL: 0.6 mg/dL (ref 0.3–1.2)
BUN: 14 mg/dL (ref 6–20)
CALCIUM: 9.4 mg/dL (ref 8.9–10.3)
CO2: 27 mmol/L (ref 22–32)
Chloride: 100 mmol/L — ABNORMAL LOW (ref 101–111)
Creatinine, Ser: 1.06 mg/dL (ref 0.61–1.24)
GFR calc non Af Amer: >60 mL/min (ref >60)
GLUCOSE: 105 mg/dL — AB (ref 65–99)
POTASSIUM: 4.2 mmol/L (ref 3.5–5.1)
SODIUM: 137 mmol/L (ref 135–145)
TOTAL PROTEIN: 7.6 g/dL (ref 6.5–8.1)

## 2016-09-16 LAB — TROPONIN I: Troponin I: 0.03 ng/mL (ref <0.03)

## 2016-09-16 LAB — CBC
HEMATOCRIT: 45.4 % (ref 40.0–52.0)
HEMOGLOBIN: 14.7 g/dL (ref 13.0–18.0)
MCH: 30.9 pg (ref 26.0–34.0)
MCHC: 32.5 g/dL (ref 32.0–36.0)
MCV: 95 fL (ref 80.0–100.0)
Platelets: 514 10*3/uL — ABNORMAL HIGH (ref 150–440)
RBC: 4.77 MIL/uL (ref 4.40–5.90)
RDW: 14.2 % (ref 11.5–14.5)
WBC: 11 10*3/uL — ABNORMAL HIGH (ref 3.8–10.6)

## 2016-09-16 LAB — LIPASE, BLOOD: Lipase: 15 U/L (ref 11–51)

## 2016-09-16 MED ORDER — ONDANSETRON 4 MG PO TBDP
4.0000 mg | ORAL_TABLET | Freq: Once | ORAL | Status: AC
  Administered 2016-09-16: 4 mg via ORAL

## 2016-09-16 MED ORDER — PIPERACILLIN-TAZOBACTAM 3.375 G IVPB 30 MIN
3.3750 g | Freq: Once | INTRAVENOUS | Status: AC
  Administered 2016-09-16: 3.375 g via INTRAVENOUS
  Filled 2016-09-16: qty 50

## 2016-09-16 MED ORDER — IOPAMIDOL (ISOVUE-300) INJECTION 61%
100.0000 mL | Freq: Once | INTRAVENOUS | Status: AC | PRN
  Administered 2016-09-16: 100 mL via INTRAVENOUS

## 2016-09-16 MED ORDER — PROMETHAZINE HCL 25 MG/ML IJ SOLN
6.2500 mg | Freq: Once | INTRAMUSCULAR | Status: AC
  Administered 2016-09-16: 6.25 mg via INTRAVENOUS
  Filled 2016-09-16: qty 1

## 2016-09-16 MED ORDER — ONDANSETRON HCL 4 MG/2ML IJ SOLN
INTRAMUSCULAR | Status: AC
  Administered 2016-09-16: 4 mg via INTRAVENOUS
  Filled 2016-09-16: qty 2

## 2016-09-16 MED ORDER — IOPAMIDOL (ISOVUE-300) INJECTION 61%
30.0000 mL | Freq: Once | INTRAVENOUS | Status: AC
  Administered 2016-09-16: 30 mL via ORAL

## 2016-09-16 MED ORDER — HYDROMORPHONE HCL 1 MG/ML IJ SOLN
1.0000 mg | Freq: Once | INTRAMUSCULAR | Status: AC
  Administered 2016-09-16: 1 mg via INTRAVENOUS
  Filled 2016-09-16: qty 1

## 2016-09-16 MED ORDER — LIDOCAINE HCL 2 % EX GEL
CUTANEOUS | Status: AC
  Administered 2016-09-17: 1
  Filled 2016-09-16: qty 10

## 2016-09-16 MED ORDER — DICYCLOMINE HCL 10 MG PO CAPS
10.0000 mg | ORAL_CAPSULE | Freq: Once | ORAL | Status: AC
  Administered 2016-09-16: 10 mg via ORAL
  Filled 2016-09-16: qty 1

## 2016-09-16 MED ORDER — SODIUM CHLORIDE 0.9 % IV BOLUS (SEPSIS)
500.0000 mL | Freq: Once | INTRAVENOUS | Status: AC
  Administered 2016-09-16: 500 mL via INTRAVENOUS

## 2016-09-16 MED ORDER — ONDANSETRON HCL 4 MG/2ML IJ SOLN
4.0000 mg | Freq: Once | INTRAMUSCULAR | Status: AC
  Administered 2016-09-16: 4 mg via INTRAVENOUS

## 2016-09-16 MED ORDER — ONDANSETRON HCL 4 MG/2ML IJ SOLN
4.0000 mg | Freq: Once | INTRAMUSCULAR | Status: AC
  Administered 2016-09-16: 4 mg via INTRAVENOUS
  Filled 2016-09-16: qty 2

## 2016-09-16 MED ORDER — LIDOCAINE HCL 2 % EX GEL
1.0000 "application " | Freq: Once | CUTANEOUS | Status: AC
  Administered 2016-09-17: 1

## 2016-09-16 MED ORDER — ONDANSETRON 4 MG PO TBDP
ORAL_TABLET | ORAL | Status: AC
  Administered 2016-09-16: 4 mg via ORAL
  Filled 2016-09-16: qty 1

## 2016-09-16 NOTE — ED Notes (Signed)
Pt assisted to repositioning. No other needs expressed.

## 2016-09-16 NOTE — ED Notes (Signed)
Pt had episode of emesis. MD made aware and gave verbal order for zofran.

## 2016-09-16 NOTE — ED Provider Notes (Signed)
Time Seen: Approximately2054  I have reviewed the triage notes  Chief Complaint: Abdominal Pain   History of Present Illness: Willie Rivas is a 65 y.o. male *who states he was seen and evaluated approximately a week ago and records show that he had a contrasted CAT scan which showed a diffuse enteritis. Patient was placed on Flagyl and Cipro and states he had symptomatic improvement up until earlier today. He states he's completed a course of antibiotics and he started developing what he describes to me is actually lower abdominal pain with some nausea and feelings of constipation. Patient states he hasn't had a normal bowel movement for the last 3 days and only had a very small amount of stool today. He is not aware of any fever at home he still feels nauseated and states he tried a Zofran but "" vomited up "". He denies any melena or hematochezia. Denies any hematemesis or biliary emesis.   Past Medical History:  Diagnosis Date  . Hypertension     There are no active problems to display for this patient.   History reviewed. No pertinent surgical history.  History reviewed. No pertinent surgical history.  Current Outpatient Rx  . Order #: [id number] Class: Print  . Order #: [id number] Class: Print  . Order #: [id number] Class: Print  . Order #: [id number] Class: Print    Allergies:  Patient has no known allergies.  Family History: No family history on file.  Social History: Social History  Substance Use Topics  . Smoking status: Former Smoker    Quit date: 09/2015  . Smokeless tobacco: Never Used  . Alcohol use No     Review of Systems:   10 point review of systems was performed and was otherwise negative:  Constitutional: No fever Eyes: No visual disturbances ENT: No sore throat, ear pain Cardiac: No chest pain Respiratory: No shortness of breath, wheezing, or stridor Abdomen: Pain seems to be actually lower middle quadrant where he points. He has no previous  surgical history. He denies any back or flank pain. He states pain is slightly worse with meals. Endocrine: No weight loss, No night sweats Extremities: No peripheral edema, cyanosis Skin: No rashes, easy bruising Neurologic: No focal weakness, trouble with speech or swollowing Urologic: No dysuria, Hematuria, or urinary frequency   Physical Exam:  ED Triage Vitals  Enc Vitals Group     BP 09/16/16 1720 (!) 163/93     Pulse Rate 09/16/16 1720 76     Resp 09/16/16 1720 18     Temp 09/16/16 1720 98.5 F (36.9 C)     Temp Source 09/16/16 1720 Oral     SpO2 09/16/16 1720 95 %     Weight 09/16/16 1720 162 lb (73.5 kg)     Height 09/16/16 1720 5\' 6"  (1.676 m)     Head Circumference --      Peak Flow --      Pain Score 09/16/16 1723 5     Pain Loc --      Pain Edu? --      Excl. in [place]? --     General: Awake , Alert , and Oriented times 3; GCS 15 Head: Normal cephalic , atraumatic Eyes: Pupils equal , round, reactive to light Nose/Throat: No nasal drainage, patent upper airway without erythema or exudate.  Neck: Supple, Full range of motion, No anterior adenopathy or palpable thyroid masses Lungs: Clear to ascultation without wheezes , rhonchi, or rales Heart: Regular rate, regular rhythm without  murmurs , gallops , or rubs Abdomen: Mild tenderness to deep palpation lower middle quadrant without rebound, guarding , or rigidity; bowel sounds positive and symmetric in all 4 quadrants. No organomegaly .        Extremities: 2 plus symmetric pulses. No edema, clubbing or cyanosis Neurologic: normal ambulation, Motor symmetric without deficits, sensory intact Skin: warm, dry, no rashes   Labs:   All laboratory work was reviewed including any pertinent negatives or positives listed below:  Labs Reviewed  COMPREHENSIVE METABOLIC PANEL - Abnormal; Notable for the following:       Result Value   Chloride 100 (*)    Glucose, Bld 105 (*)    All other components within normal limits   CBC - Abnormal; Notable for the following:    WBC 11.0 (*)    Platelets 514 (*)    All other components within normal limits  URINALYSIS COMPLETEWITH MICROSCOPIC ([facility] ONLY) - Abnormal; Notable for the following:    Color, Urine AMBER (*)    APPearance CLEAR (*)    Ketones, ur TRACE (*)    Protein, ur 30 (*)    Leukocytes, UA 1+ (*)    Squamous Epithelial / LPF 0-5 (*)    All other components within normal limits  TROPONIN I - Abnormal; Notable for the following:    Troponin I 0.03 (*)    All other components within normal limits  LIPASE, BLOOD  Comparison shows a white blood cell count seems to have decreased since 09/08/2016  EKG:  ED ECG REPORT I, Daymon Larsen, the attending physician, personally viewed and interpreted this ECG.  Date: 09/16/2016 EKG Time: 1723 Rate: 80 Rhythm: normal sinus rhythm QRS Axis: normal Intervals: normal ST/T Wave abnormalities: Nonspecific T wave abnormalities Conduction Disturbances: none Narrative Interpretation: unremarkable Large artifact. No obvious acute ischemic changes   Radiology: "Ct Abdomen Pelvis W Contrast  Result Date: 09/16/2016 CLINICAL DATA:  Recent diagnosis of enterocolitis. Upper abdominal pain, nausea and constipation. Hiccups. EXAM: CT ABDOMEN AND PELVIS WITH CONTRAST TECHNIQUE: Multidetector CT imaging of the abdomen and pelvis was performed using the standard protocol following bolus administration of intravenous contrast. CONTRAST:  154mL ISOVUE-300 IOPAMIDOL (ISOVUE-300) INJECTION 61% COMPARISON:  CT abdomen and pelvis September 08, 2016 FINDINGS: Mild respiratory motion degraded examination. LOWER CHEST: Lung bases are clear. Included heart size is normal. No pericardial effusion. HEPATOBILIARY: 14 mm nonenhancing cyst RIGHT lobe of the liver, liver is otherwise unremarkable. Normal gallbladder. PANCREAS: Normal. SPLEEN: Normal. ADRENALS/URINARY TRACT: Kidneys are orthotopic, demonstrating symmetric enhancement. No  nephrolithiasis, hydronephrosis or solid renal masses. LEFT upper pole 12 mm cyst. Delayed imaging through the kidneys demonstrates symmetric prompt contrast excretion within the proximal urinary collecting system. Urinary bladder is partially distended and unremarkable. Normal adrenal glands. STOMACH/BOWEL: Small hiatal hernia. Enteric contrast has not yet reached the distal small bowel. Circumferential terminal ileal wall thickening and inflammation resulting in the small bowel obstruction, small bowel dilated up 4.2 cm with distal small bowel feces. 5.1 cm central pelvis loop of stool-filled structure with peripheral enhancement, this does not and clearly communicate with the small bowel (axial 65/94). VASCULAR/LYMPHATIC: Aortoiliac vessels are normal in course and caliber, mild calcific atherosclerosis. No lymphadenopathy by CT size criteria. REPRODUCTIVE: Mild prostatomegaly invading the base of the bladder. OTHER: Small to moderate amount of low-density ascites is new. No intraperitoneal free air. MUSCULOSKELETAL: Nonacute. Small fat containing inguinal hernias. Mild thoracolumbar dextroscoliosis. Moderate bilateral sacroiliac osteoarthrosis, possible component sacroiliitis on the LEFT as there are  erosions without bone formation. Moderate lumbar facet arthropathy. IMPRESSION: Small bowel obstruction secondary to terminal ileal inflammation and suspected contained 5 cm perforation/ abscess within central pelvis. Constellation of findings suggests Crohn's disease. Small to moderate amount of new ascites. Acute findings discussed with and reconfirmed by Lenor Derrick on 09/16/2016 at 9:54 pm. Electronically Signed   By: Elon Alas M.D.   On: 09/16/2016 21:56   Ct Abdomen Pelvis W Contrast  Result Date: 09/09/2016 CLINICAL DATA:  Nausea, vomiting and diarrhea. Lower abdominal pain since Sunday after eating. EXAM: CT ABDOMEN AND PELVIS WITH CONTRAST TECHNIQUE: Multidetector CT imaging of the abdomen  and pelvis was performed using the standard protocol following bolus administration of intravenous contrast. CONTRAST:  128mL ISOVUE-300 IOPAMIDOL (ISOVUE-300) INJECTION 61% COMPARISON:  08/31/2015 CT FINDINGS: Lower chest: No acute abnormality. Small hiatal hernia. Normal appearing visualized cardiac chambers. No pericardial effusion or thickening. Hepatobiliary: There is a stable simple appearing right hepatic cyst measuring 1.6 cm in diameter with Hounsfield unit of 3. No gallstones, gallbladder wall thickening, or biliary dilatation. Pancreas: Unremarkable. No pancreatic ductal dilatation or surrounding inflammatory changes. Spleen: Faint sub centimeter areas of hypodensity noted within, nonspecific in etiology. These appear stable relative to the unenhanced exam from 2016 consistent with benign findings possibly small hemangiomata. Adrenals/Urinary Tract: Stable appearance of the adrenal glands. Stable 9 mm cyst in the interpolar left kidney. No obstructive uropathy. No solid enhancing mass lesions of either kidney. The urinary bladder is normal. Stomach/Bowel: The stomach is distended with oral contrast. The duodenum is normal in caliber as of proximal to mid loops of jejunum. However, further distally there is abnormal diffuse distention and thickening of small bowel loops to the level of the terminal ileum with a thickened fluid-filled appearance of large bowel. Within the mid pelvis on series 2, image 59 are 3 dots of air difficult to determine whether these are extraluminal given surrounding mesenteric edema. These may simply represent colonic diverticula as there is a similar rounded density containing what appears be possible oral contrast. Vascular/Lymphatic: There is atherosclerosis of the abdominal aorta. The celiac axis, SMA and diminutive IMA are patent. The common iliac arteries are atherosclerotic but patent. There are small sub cm right lower quadrant mesenteric and retroperitoneal nodes.  Reproductive: Enlarged prostate with central and peripheral zone calcifications. Other: No abdominal wall hernia or abnormality. No abdominopelvic ascites. Musculoskeletal: Dextroscoliosis of the thoracolumbar junction IMPRESSION: Abnormal small and large bowel thickening and distention suggesting moderate to severe enterocolitis. Hazy mesenteric edema in the mid pelvis is noted with a few dots of rounded air leg lucencies possibly representing diverticula. Extraluminal gas believed less likely however, the enteric contrast ingested has not reached this area for further assessment. Repeat delayed imaging in the a.m. may prove helpful. Electronically Signed   By: Ashley Royalty M.D.   On: 09/09/2016 00:45  "  I personally reviewed the radiologic studies    ED Course:  Patient's stay here was overall uneventful but he still had some persistent vomiting and due to what appears to be a small bowel obstruction will place a nasogastric tube to relieve pressure. We will continue with anti-medic therapy and establish surgical consultation. Antibiotics after discussion with the surgeon likely to be initiated. Clinical Course      Assessment:  Small bowel obstruction Possible terminal ileitis (Crohn's)      Plan:  Surgical consultation            Daymon Larsen, MD 09/16/16 2202

## 2016-09-16 NOTE — ED Notes (Signed)
Notified charge nurse of troponin results

## 2016-09-16 NOTE — ED Notes (Signed)
Pt reports nausea has decreased but cannot drink any more contrast. Pt still has one unopened bottle.

## 2016-09-16 NOTE — ED Triage Notes (Signed)
Patient presents to the ED with upper abdominal pain, nausea and constipation.  Patient reports small bm today.  Patient has hiccups during triage.  Patient states he was seen for similar symptoms about 1 week ago and was given medication which helped symptoms improve but now they are back.  Patient is unsure of what he was diagnosed with or what medications he was given.  Patient denies any vomiting or diarrhea.  Patient is in no obvious distress at this time.

## 2016-09-17 ENCOUNTER — Encounter
Admission: EM | Discharge status: Against Medical Advice | Payer: Self-pay | Source: Home / Self Care | Attending: Internal Medicine

## 2016-09-17 ENCOUNTER — Emergency Department: Payer: Self-pay

## 2016-09-17 DIAGNOSIS — K56609 Unspecified intestinal obstruction, unspecified as to partial versus complete obstruction: Secondary | ICD-10-CM

## 2016-09-17 LAB — GLUCOSE, CAPILLARY: Glucose-Capillary: 114 mg/dL — ABNORMAL HIGH (ref 65–99)

## 2016-09-17 LAB — TROPONIN I: Troponin I: <0.03 ng/mL (ref <0.03)

## 2016-09-17 LAB — TSH: TSH: 0.353 u[IU]/mL (ref 0.350–4.500)

## 2016-09-17 SURGERY — APPENDECTOMY, LAPAROSCOPIC
Anesthesia: Choice

## 2016-09-17 MED ORDER — ACETAMINOPHEN 650 MG RE SUPP: 650.0000 mg | Freq: Four times a day (QID) | RECTAL | Status: DC | PRN

## 2016-09-17 MED ORDER — MORPHINE SULFATE (PF) 4 MG/ML IV SOLN
2.0000 mg | INTRAVENOUS | Status: DC | PRN
  Administered 2016-09-18: 2 mg via INTRAVENOUS
  Filled 2016-09-17: qty 1

## 2016-09-17 MED ORDER — METRONIDAZOLE IN NACL 5-0.79 MG/ML-% IV SOLN
500.0000 mg | Freq: Three times a day (TID) | INTRAVENOUS | Status: DC
  Administered 2016-09-17 – 2016-09-20 (×10): 500 mg via INTRAVENOUS
  Filled 2016-09-17 (×12): qty 100

## 2016-09-17 MED ORDER — HYDROCODONE-ACETAMINOPHEN 5-325 MG PO TABS: 1.0000 | ORAL_TABLET | Freq: Four times a day (QID) | ORAL | Status: DC | PRN

## 2016-09-17 MED ORDER — ENOXAPARIN SODIUM 40 MG/0.4ML ~~LOC~~ SOLN
40.0000 mg | SUBCUTANEOUS | Status: DC
  Administered 2016-09-17 – 2016-09-19 (×3): 40 mg via SUBCUTANEOUS
  Filled 2016-09-17 (×3): qty 0.4

## 2016-09-17 MED ORDER — PIPERACILLIN-TAZOBACTAM 3.375 G IVPB
3.3750 g | Freq: Three times a day (TID) | INTRAVENOUS | Status: DC
Start: 2016-09-17 — End: 2016-09-20
  Administered 2016-09-17 – 2016-09-20 (×8): 3.375 g via INTRAVENOUS
  Filled 2016-09-17 (×8): qty 50

## 2016-09-17 MED ORDER — ONDANSETRON HCL 4 MG/2ML IJ SOLN
4.0000 mg | Freq: Four times a day (QID) | INTRAMUSCULAR | Status: DC | PRN
  Administered 2016-09-18 – 2016-09-19 (×2): 4 mg via INTRAVENOUS
  Filled 2016-09-17 (×2): qty 2

## 2016-09-17 MED ORDER — PHENOL 1.4 % MT LIQD
1.0000 | OROMUCOSAL | Status: DC | PRN
  Administered 2016-09-17 – 2016-09-18 (×5): 1 via OROMUCOSAL
  Filled 2016-09-17: qty 177

## 2016-09-17 MED ORDER — PIPERACILLIN-TAZOBACTAM 4.5 G IVPB
4.5000 g | Freq: Three times a day (TID) | INTRAVENOUS | Status: DC
  Administered 2016-09-17: 4.5 g via INTRAVENOUS
  Filled 2016-09-17 (×3): qty 100

## 2016-09-17 MED ORDER — ACETAMINOPHEN 325 MG PO TABS: 650.0000 mg | ORAL_TABLET | Freq: Four times a day (QID) | ORAL | Status: DC | PRN

## 2016-09-17 MED ORDER — DOCUSATE SODIUM 100 MG PO CAPS
100.0000 mg | ORAL_CAPSULE | Freq: Two times a day (BID) | ORAL | Status: DC
  Administered 2016-09-17 – 2016-09-18 (×2): 100 mg via ORAL
  Filled 2016-09-17 (×6): qty 1

## 2016-09-17 MED ORDER — ONDANSETRON HCL 4 MG PO TABS: 4.0000 mg | ORAL_TABLET | Freq: Four times a day (QID) | ORAL | Status: DC | PRN

## 2016-09-17 MED ORDER — DEXTROSE-NACL 5-0.9 % IV SOLN
INTRAVENOUS | Status: DC
  Administered 2016-09-17 – 2016-09-19 (×5): via INTRAVENOUS

## 2016-09-17 NOTE — Progress Notes (Signed)
Admitted this morning because of abdominal pain, some nausea and vomiting. Found to have small bowel obstruction, intestinal abscesses. Admitted to medical unit started on IV antibiotics, IV fluids, NG tube inserted. Patient the use of better after NG tube insertion, denies abdominal pain. NG tube  To LIS,  Pelvic abscess: crohn's/perforated appendix;surgery following for possible drainage; continue IV fluids, IV antibiotics, NG tube.

## 2016-09-17 NOTE — H&P (Signed)
Willie Rivas is an 65 y.o. male.   Chief Complaint: Abdominal pain HPI: The patient with past medical history of hypertension presents to the emergency department complaining of abdominal pain. He states that his abdomen has been hurting for approximately one week during which time he has had multiple episodes of nausea and vomiting. He admits to some episodes of hematochezia as well. The patient states that he's had some diarrhea during the last week but now states that he is unable to have a good bowel movement. After narcotic medication in the emergency department the patient denies abdominal pain. CT of the abdomen showed inflammation possibly consistent with Crohn's disease as well as partial small bowel obstruction and intestinal abscess. Surgery was consulted who stated that surgical intervention wasn't necessary at this time but that he would need IV antibiotics which prompted emergency department staff to call the hospitalist service for admission.  Past Medical History:  Diagnosis Date  . Hypertension     History reviewed. No pertinent surgical history. None  Family History  Problem Relation Age of Onset  . Heart disease Mother   hypertension throughout the family  Social History:  reports that he quit smoking about 12 months ago. He has never used smokeless tobacco. He reports that he does not drink alcohol. His drug history is not on file.  Allergies: No Known Allergies  Medications Prior to Admission  Medication Sig Dispense Refill  . ciprofloxacin (CIPRO) 500 MG tablet Take 1 tablet (500 mg total) by mouth 2 (two) times daily. 14 tablet 0  . HYDROcodone-acetaminophen (NORCO) 5-325 MG tablet Take 1 tablet by mouth every 6 (six) hours as needed for moderate pain. 15 tablet 0  . metroNIDAZOLE (FLAGYL) 500 MG tablet Take 1 tablet (500 mg total) by mouth 2 (two) times daily. 14 tablet 0    Results for orders placed or performed during the hospital encounter of 09/16/16 (from the  past 48 hour(s))  Lipase, blood     Status: None   Collection Time: 09/16/16  5:26 PM  Result Value Ref Range   Lipase 15 11 - 51 U/L  Comprehensive metabolic panel     Status: Abnormal   Collection Time: 09/16/16  5:26 PM  Result Value Ref Range   Sodium 137 135 - 145 mmol/L   Potassium 4.2 3.5 - 5.1 mmol/L   Chloride 100 (L) 101 - 111 mmol/L   CO2 27 22 - 32 mmol/L   Glucose, Bld 105 (H) 65 - 99 mg/dL   BUN 14 6 - 20 mg/dL   Creatinine, Ser 1.06 0.61 - 1.24 mg/dL   Calcium 9.4 8.9 - 10.3 mg/dL   Total Protein 7.6 6.5 - 8.1 g/dL   Albumin 4.0 3.5 - 5.0 g/dL   AST 24 15 - 41 U/L   ALT 30 17 - 63 U/L   Alkaline Phosphatase 54 38 - 126 U/L   Total Bilirubin 0.6 0.3 - 1.2 mg/dL   GFR calc non Af Amer >60 >60 mL/min   GFR calc Af Amer >60 >60 mL/min    Comment: (NOTE) The eGFR has been calculated using the CKD EPI equation. This calculation has not been validated in all clinical situations. eGFR's persistently <60 mL/min signify possible Chronic Kidney Disease.    Anion gap 10 5 - 15  CBC     Status: Abnormal   Collection Time: 09/16/16  5:26 PM  Result Value Ref Range   WBC 11.0 (H) 3.8 - 10.6 K/uL  RBC 4.77 4.40 - 5.90 MIL/uL   Hemoglobin 14.7 13.0 - 18.0 g/dL   HCT 45.4 40.0 - 52.0 %   MCV 95.0 80.0 - 100.0 fL   MCH 30.9 26.0 - 34.0 pg   MCHC 32.5 32.0 - 36.0 g/dL   RDW 14.2 11.5 - 14.5 %   Platelets 514 (H) 150 - 440 K/uL  Urinalysis complete, with microscopic     Status: Abnormal   Collection Time: 09/16/16  5:27 PM  Result Value Ref Range   Color, Urine AMBER (A) YELLOW   APPearance CLEAR (A) CLEAR   Glucose, UA NEGATIVE NEGATIVE mg/dL   Bilirubin Urine NEGATIVE NEGATIVE   Ketones, ur TRACE (A) NEGATIVE mg/dL   Specific Gravity, Urine 1.026 1.005 - 1.030   Hgb urine dipstick NEGATIVE NEGATIVE   pH 5.0 5.0 - 8.0   Protein, ur 30 (A) NEGATIVE mg/dL   Nitrite NEGATIVE NEGATIVE   Leukocytes, UA 1+ (A) NEGATIVE   RBC / HPF 0-5 0 - 5 RBC/hpf   WBC, UA 0-5 0 - 5  WBC/hpf   Bacteria, UA NONE SEEN NONE SEEN   Squamous Epithelial / LPF 0-5 (A) NONE SEEN   Mucous PRESENT    Hyaline Casts, UA PRESENT    Cellular Cast, UA 3   Troponin I     Status: Abnormal   Collection Time: 09/16/16  5:36 PM  Result Value Ref Range   Troponin I 0.03 (HH) <0.03 ng/mL    Comment: CRITICAL RESULT CALLED TO, READ BACK BY AND VERIFIED WITH RAQUEL DAVID 09/16/16 @ 1948  MLK   Troponin I     Status: None   Collection Time: 09/17/16  1:43 AM  Result Value Ref Range   Troponin I <0.03 <0.03 ng/mL  TSH     Status: None   Collection Time: 09/17/16  4:47 AM  Result Value Ref Range   TSH 0.353 0.350 - 4.500 uIU/mL    Comment: Performed by a 3rd Generation assay with a functional sensitivity of <=0.01 uIU/mL.   Dg Abdomen 1 View  Result Date: 09/17/2016 CLINICAL DATA:  NG placement EXAM: ABDOMEN - 1 VIEW COMPARISON:  09/16/2016 FINDINGS: The nasogastric tube extends well into the stomach. It curves on itself, with tip in the region of the fundus. IMPRESSION: Enteric tube extends well into the stomach. Electronically Signed   By: Andreas Newport M.D.   On: 09/17/2016 00:50   Ct Abdomen Pelvis W Contrast  Result Date: 09/16/2016 CLINICAL DATA:  Recent diagnosis of enterocolitis. Upper abdominal pain, nausea and constipation. Hiccups. EXAM: CT ABDOMEN AND PELVIS WITH CONTRAST TECHNIQUE: Multidetector CT imaging of the abdomen and pelvis was performed using the standard protocol following bolus administration of intravenous contrast. CONTRAST:  125m ISOVUE-300 IOPAMIDOL (ISOVUE-300) INJECTION 61% COMPARISON:  CT abdomen and pelvis September 08, 2016 FINDINGS: Mild respiratory motion degraded examination. LOWER CHEST: Lung bases are clear. Included heart size is normal. No pericardial effusion. HEPATOBILIARY: 14 mm nonenhancing cyst RIGHT lobe of the liver, liver is otherwise unremarkable. Normal gallbladder. PANCREAS: Normal. SPLEEN: Normal. ADRENALS/URINARY TRACT: Kidneys are  orthotopic, demonstrating symmetric enhancement. No nephrolithiasis, hydronephrosis or solid renal masses. LEFT upper pole 12 mm cyst. Delayed imaging through the kidneys demonstrates symmetric prompt contrast excretion within the proximal urinary collecting system. Urinary bladder is partially distended and unremarkable. Normal adrenal glands. STOMACH/BOWEL: Small hiatal hernia. Enteric contrast has not yet reached the distal small bowel. Circumferential terminal ileal wall thickening and inflammation resulting in the small bowel obstruction, small  bowel dilated up 4.2 cm with distal small bowel feces. 5.1 cm central pelvis loop of stool-filled structure with peripheral enhancement, this does not and clearly communicate with the small bowel (axial 65/94). VASCULAR/LYMPHATIC: Aortoiliac vessels are normal in course and caliber, mild calcific atherosclerosis. No lymphadenopathy by CT size criteria. REPRODUCTIVE: Mild prostatomegaly invading the base of the bladder. OTHER: Small to moderate amount of low-density ascites is new. No intraperitoneal free air. MUSCULOSKELETAL: Nonacute. Small fat containing inguinal hernias. Mild thoracolumbar dextroscoliosis. Moderate bilateral sacroiliac osteoarthrosis, possible component sacroiliitis on the LEFT as there are erosions without bone formation. Moderate lumbar facet arthropathy. IMPRESSION: Small bowel obstruction secondary to terminal ileal inflammation and suspected contained 5 cm perforation/ abscess within central pelvis. Constellation of findings suggests Crohn's disease. Small to moderate amount of new ascites. Acute findings discussed with and reconfirmed by Lenor Derrick on 09/16/2016 at 9:54 pm. Electronically Signed   By: Elon Alas M.D.   On: 09/16/2016 21:56    Review of Systems  Constitutional: Negative for chills and fever.  HENT: Negative for sore throat and tinnitus.   Eyes: Negative for blurred vision and redness.  Respiratory: Negative  for cough and shortness of breath.   Cardiovascular: Negative for chest pain, palpitations, orthopnea and PND.  Gastrointestinal: Positive for abdominal pain, diarrhea, nausea and vomiting.  Genitourinary: Negative for dysuria, frequency and urgency.  Musculoskeletal: Negative for joint pain and myalgias.  Skin: Negative for rash.       No lesions  Neurological: Negative for speech change, focal weakness and weakness.  Endo/Heme/Allergies: Does not bruise/bleed easily.       No temperature intolerance  Psychiatric/Behavioral: Negative for depression and suicidal ideas.    Blood pressure (!) 143/80, pulse 72, temperature 98.2 F (36.8 C), temperature source Oral, resp. rate 19, height _0  (1.676 m), weight 74 kg (163 lb 1.6 oz), SpO2 95 %. Physical Exam  Constitutional: He is oriented to person, place, and time. He appears well-developed and well-nourished. No distress.  HENT:  Head: Normocephalic and atraumatic.  Mouth/Throat: Oropharynx is clear and moist.  Eyes: Conjunctivae and EOM are normal. Pupils are equal, round, and reactive to light. No scleral icterus.  Neck: Normal range of motion. Neck supple. No JVD present. No tracheal deviation present. No thyromegaly present.  Cardiovascular: Normal rate, regular rhythm and normal heart sounds.  Exam reveals no gallop and no friction rub.   No murmur heard. Respiratory: Effort normal and breath sounds normal. No respiratory distress. He has no wheezes.  GI: Soft. Bowel sounds are normal. He exhibits distension. He exhibits no mass. There is no tenderness. There is no rebound and no guarding.  Genitourinary:  Genitourinary Comments: Deferred  Musculoskeletal: Normal range of motion. He exhibits no edema.  Lymphadenopathy:    He has no cervical adenopathy.  Neurological: He is alert and oriented to person, place, and time. No cranial nerve deficit.  Skin: Skin is warm and dry. No rash noted. No erythema.  Psychiatric: He has a normal  mood and affect. His behavior is normal. Judgment and thought content normal.     Assessment/Plan This is a 65 year old male admitted for more bowel obstruction and intestinal abscess. 1. Multifocal intestinal inflammation: Possibly consistent with Crohn's disease. Small bowel obstruction and abscess present. The patient had been taken Cipro and Flagyl as an outpatient. He was started on Zosyn in the emergency department and I will add IV Flagyl as well. NG tube has been placed to decompress the bowel per recommendations  from the surgical service who will consult while he is hospitalized. 2. DVT prophylaxis: Lovenox 3. GI prophylaxis: None The patient is a full code. Time spent on admission orders and patient care approximately 45 minutes  Harrie Foreman, MD 09/17/2016, 7:31 AM

## 2016-09-17 NOTE — ED Notes (Signed)
Error in charting. NG placed not irrigated at [place]. NG placed with assisted from [place], [place]. Verified placement and order placed for abd xray for further verification.

## 2016-09-17 NOTE — Progress Notes (Signed)
Pharmacy Antibiotic Note  Willie Rivas is a 65 y.o. male admitted on 09/16/2016 with intra-abdominal infection.  Pharmacy has been consulted for Zosyn dosing.  Plan: Will change to Zosyn 3.375 grams q 8 hours Height: 5\' 6"  (167.6 cm) Weight: 163 lb 1.6 oz (74 kg) IBW/kg (Calculated) : 63.8  Temp (24hrs), Avg:98.4 F (36.9 C), Min:98.2 F (36.8 C), Max:98.5 F (36.9 C)   Recent Labs Lab 09/16/16 1726  WBC 11.0*  CREATININE 1.06    Estimated Creatinine Clearance: 63.5 mL/min (by C-G formula based on SCr of 1.06 mg/dL).    No Known Allergies  Antimicrobials this admission: Zosyn 11/17 >>   metronidazole 11/17 >>   Dose adjustments this admission:   Microbiology results: No micro     11/16 UA: LE(+) NO2(-) WBC 0-5  Thank you for allowing pharmacy to be a part of this patient's care.  Sofiah Lyne D 09/17/2016 11:43 AM

## 2016-09-17 NOTE — Progress Notes (Signed)
Pharmacy Antibiotic Note  Willie Rivas is a 65 y.o. male admitted on 09/16/2016 with intra-abdominal infection.  Pharmacy has been consulted for Zosyn dosing.  Plan: Zosyn 4.5 grams q 8 hours ordered for Pseudomonas risk of recent abx as outpatient.  Height: 5\' 6"  (167.6 cm) Weight: 162 lb (73.5 kg) IBW/kg (Calculated) : 63.8  Temp (24hrs), Avg:98.4 F (36.9 C), Min:98.2 F (36.8 C), Max:98.5 F (36.9 C)   Recent Labs Lab 09/16/16 1726  WBC 11.0*  CREATININE 1.06    Estimated Creatinine Clearance: 63.5 mL/min (by C-G formula based on SCr of 1.06 mg/dL).    No Known Allergies  Antimicrobials this admission: Zosyn 11/17 >>   metronidazole 11/17 >>   Dose adjustments this admission:   Microbiology results: No micro     11/16 UA: LE(+) NO2(-) WBC 0-5  Thank you for allowing pharmacy to be a part of this patient's care.  Ivo Moga S 09/17/2016 3:19 AM

## 2016-09-17 NOTE — ED Provider Notes (Signed)
-----------------------------------------   12:00 AM on 09/17/2016 -----------------------------------------  Patient was seen by Dr. Dahlia Byes who recommends nonsurgical treatment; admission to hospitalist, NG tube decompression, IV antibiotics and GI consultation. Discussed with hospitalist who will evaluate patient in the emergency department for admission.   Paulette Blanch, MD 09/17/16 0001

## 2016-09-17 NOTE — Progress Notes (Signed)
After obtaining verbal permission from patient, note provided to patient's daughter for work stating that she had been with her father at the hospital.

## 2016-09-17 NOTE — Progress Notes (Signed)
CC: Abdominal pain Subjective: Patient reports that he already feels much better than when he came into the emergency room. He has tolerated placement of his NG tube without difficulty and his abdominal pain again is improved. He has no other active complaints currently.  Objective: Vital signs in last 24 hours: Temp:  [98.2 F (36.8 C)-98.5 F (36.9 C)] 98.2 F (36.8 C) (11/17 0259) Pulse Rate:  [67-87] 72 (11/17 0259) Resp:  [16-19] 19 (11/17 0259) BP: (124-170)/(70-103) 143/80 (11/17 0259) SpO2:  [92 %-96 %] 95 % (11/17 0259) Weight:  [73.5 kg (162 lb)-74 kg (163 lb 1.6 oz)] 74 kg (163 lb 1.6 oz) (11/17 0259) Last BM Date: 09/16/16  Intake/Output from previous day: 11/16 0701 - 11/17 0700 In: 249.3 [IV Piggyback:249.3] Out: -  Intake/Output this shift: Total I/O In: -  Out: 150 [Urine:150]  Physical exam:  Gen.: No acute distress Chest: Clear to auscultation Heart: Regular rate and rhythm Abdomen: Soft, minimally tender to the right lower quadrant, nondistended  Lab Results: CBC   Recent Labs  09/16/16 1726  WBC 11.0*  HGB 14.7  HCT 45.4  PLT 514*   BMET  Recent Labs  09/16/16 1726  NA 137  K 4.2  CL 100*  CO2 27  GLUCOSE 105*  BUN 14  CREATININE 1.06  CALCIUM 9.4   PT/INR No results for input(s): LABPROT, INR in the last 72 hours. ABG No results for input(s): PHART, HCO3 in the last 72 hours.  Invalid input(s): PCO2, PO2  Studies/Results: Dg Abdomen 1 View  Result Date: 09/17/2016 CLINICAL DATA:  NG placement EXAM: ABDOMEN - 1 VIEW COMPARISON:  09/16/2016 FINDINGS: The nasogastric tube extends well into the stomach. It curves on itself, with tip in the region of the fundus. IMPRESSION: Enteric tube extends well into the stomach. Electronically Signed   By: Andreas Newport M.D.   On: 09/17/2016 00:50   Ct Abdomen Pelvis W Contrast  Result Date: 09/16/2016 CLINICAL DATA:  Recent diagnosis of enterocolitis. Upper abdominal pain, nausea and  constipation. Hiccups. EXAM: CT ABDOMEN AND PELVIS WITH CONTRAST TECHNIQUE: Multidetector CT imaging of the abdomen and pelvis was performed using the standard protocol following bolus administration of intravenous contrast. CONTRAST:  161mL ISOVUE-300 IOPAMIDOL (ISOVUE-300) INJECTION 61% COMPARISON:  CT abdomen and pelvis September 08, 2016 FINDINGS: Mild respiratory motion degraded examination. LOWER CHEST: Lung bases are clear. Included heart size is normal. No pericardial effusion. HEPATOBILIARY: 14 mm nonenhancing cyst RIGHT lobe of the liver, liver is otherwise unremarkable. Normal gallbladder. PANCREAS: Normal. SPLEEN: Normal. ADRENALS/URINARY TRACT: Kidneys are orthotopic, demonstrating symmetric enhancement. No nephrolithiasis, hydronephrosis or solid renal masses. LEFT upper pole 12 mm cyst. Delayed imaging through the kidneys demonstrates symmetric prompt contrast excretion within the proximal urinary collecting system. Urinary bladder is partially distended and unremarkable. Normal adrenal glands. STOMACH/BOWEL: Small hiatal hernia. Enteric contrast has not yet reached the distal small bowel. Circumferential terminal ileal wall thickening and inflammation resulting in the small bowel obstruction, small bowel dilated up 4.2 cm with distal small bowel feces. 5.1 cm central pelvis loop of stool-filled structure with peripheral enhancement, this does not and clearly communicate with the small bowel (axial 65/94). VASCULAR/LYMPHATIC: Aortoiliac vessels are normal in course and caliber, mild calcific atherosclerosis. No lymphadenopathy by CT size criteria. REPRODUCTIVE: Mild prostatomegaly invading the base of the bladder. OTHER: Small to moderate amount of low-density ascites is new. No intraperitoneal free air. MUSCULOSKELETAL: Nonacute. Small fat containing inguinal hernias. Mild thoracolumbar dextroscoliosis. Moderate bilateral sacroiliac osteoarthrosis, possible  component sacroiliitis on the LEFT as there  are erosions without bone formation. Moderate lumbar facet arthropathy. IMPRESSION: Small bowel obstruction secondary to terminal ileal inflammation and suspected contained 5 cm perforation/ abscess within central pelvis. Constellation of findings suggests Crohn's disease. Small to moderate amount of new ascites. Acute findings discussed with and reconfirmed by Lenor Derrick on 09/16/2016 at 9:54 pm. Electronically Signed   By: Elon Alas M.D.   On: 09/16/2016 21:56    Anti-infectives: Anti-infectives    Start     Dose/Rate Route Frequency Ordered Stop   09/17/16 0600  piperacillin-tazobactam (ZOSYN) IVPB 4.5 g     4.5 g 25 mL/hr over 240 Minutes Intravenous Every 8 hours 09/17/16 0238     09/17/16 0400  metroNIDAZOLE (FLAGYL) IVPB 500 mg     500 mg 100 mL/hr over 60 Minutes Intravenous Every 8 hours 09/17/16 0230     09/16/16 2215  piperacillin-tazobactam (ZOSYN) IVPB 3.375 g     3.375 g 100 mL/hr over 30 Minutes Intravenous  Once 09/16/16 2203 09/16/16 2358      Assessment/Plan:  65 year old male admitted with a pelvic abscess and CT findings of terminal ileitis. Upon my review there is question as to whether not this is from Crohn's disease or a missed perforated appendicitis. Discussed with this patient regardless of the cause of his abscess the abscess will need to be drained. I have contacted radiology for them to assess whether or not he is eligible for percutaneous drainage of this abscess. If they're unable to percutaneously access the abscess discussed with the patient that he may require laparoscopic drainage. Surgery will continue to follow closely.  Kacelyn Rowzee T. Adonis Huguenin, MD, FACS  09/17/2016

## 2016-09-17 NOTE — Consult Note (Signed)
Patient ID: Willie Rivas, male   DOB: 1951/07/16, 65 y.o.   MRN: [id number]  HPI Willie Rivas is a 65 y.o. male asked to see by the emergency room team for small bowel obstruction. Patient history significant for a previous episode less than 10 days ago with abdominal pain and nausea. Patient at that time was scanned and diagnosis of enteritis was made and he was managed as an outpatient with antibiotics. Now he presents with abdominal pain starting less than 24 hours ago is diffuse intermittent and is dull in nature. The pain is mild to moderate and has responded well to narcotics. Actually feels very well and is passing gas and did have a bowel movement today. He has have nausea and vomited once today. Upon questioning he reports some occasional hematochezia. No family history of chronic disease. Current CT scan was performed showing evidence of thickening of the terminal ileum with partial small bowel obstruction and an abscess. There is a stricture disease suggestive of Crohn's disease.   HPI  Past Medical History:  Diagnosis Date  . Hypertension     History reviewed. No pertinent surgical history.  No family history on file.  Social History Social History  Substance Use Topics  . Smoking status: Former Smoker    Quit date: 09/2015  . Smokeless tobacco: Never Used  . Alcohol use No    No Known Allergies  No current facility-administered medications for this encounter.    Current Outpatient Prescriptions  Medication Sig Dispense Refill  . ciprofloxacin (CIPRO) 500 MG tablet Take 1 tablet (500 mg total) by mouth 2 (two) times daily. 14 tablet 0  . HYDROcodone-acetaminophen (NORCO) 5-325 MG tablet Take 1 tablet by mouth every 6 (six) hours as needed for moderate pain. 15 tablet 0  . metroNIDAZOLE (FLAGYL) 500 MG tablet Take 1 tablet (500 mg total) by mouth 2 (two) times daily. 14 tablet 0     Review of Systems A 10 point review of systems was asked and was negative except for the  information on the HPI  Physical Exam Blood pressure 140/76, pulse 69, temperature 98.5 F (36.9 C), temperature source Oral, resp. rate 18, height 5\' 6"  (1.676 m), weight 73.5 kg (162 lb), SpO2 92 %. CONSTITUTIONAL: NAD, resting comfortable, no complaints.  EYES: Pupils are equal, round, and reactive to light, Sclera are non-icteric. EARS, NOSE, MOUTH AND THROAT: The oropharynx is clear. The oral mucosa is pink and moist. Hearing is intact to voice. LYMPH NODES:  Lymph nodes in the neck are normal. RESPIRATORY:  Lungs are clear. There is normal respiratory effort, with equal breath sounds bilaterally, and without pathologic use of accessory muscles. CARDIOVASCULAR: Heart is regular without murmurs, gallops, or rubs. GI: The abdomen is  soft, nontender, and nondistended. There are no palpable masses. There is no hepatosplenomegaly. There are normal bowel sounds in all quadrants. GU: Rectal deferred.   MUSCULOSKELETAL: Normal muscle strength and tone. No cyanosis or edema.   SKIN: Turgor is good and there are no pathologic skin lesions or ulcers. NEUROLOGIC: Motor and sensation is grossly normal. Cranial nerves are grossly intact. PSYCH:  Oriented to person, place and time. Affect is normal.  Data Reviewed  I have personally reviewed the patient's imaging, laboratory findings and medical records.    Assessment/Plan Partial small bowel obstruction from likely Crohn's disease associated with an abscess. Patient is nontoxic and no evidence of peritonitis. We will recommend an admission, nothing by mouth NG tube IV antibiotics and and  serial abdominal exams. He will also benefit from GI consultation given the possibility of Crohn's disease. The deep abscess oh units unlikely to be amenable for percutaneous drainage given its location. If his clinical condition deteriorates might suggest repeating another CT scan and reassess in that collection. No need for immediate surgical revision at this time  we'll continue to follow obviously if he is medical treatment does not respond may need surgical intervention but as stated above no need for any emergency procedures tonight. D/W ER team in detail.   Caroleen Hamman, MD FACS General Surgeon 09/17/2016, 12:41 AM

## 2016-09-18 LAB — HEMOGLOBIN A1C
Hgb A1c MFr Bld: 5.9 % — ABNORMAL HIGH (ref 4.8–5.6)
Mean Plasma Glucose: 123 mg/dL

## 2016-09-18 LAB — CBC
HCT: 33.7 % — ABNORMAL LOW (ref 40.0–52.0)
Hemoglobin: 11.6 g/dL — ABNORMAL LOW (ref 13.0–18.0)
MCH: 32 pg (ref 26.0–34.0)
MCHC: 34.4 g/dL (ref 32.0–36.0)
MCV: 93 fL (ref 80.0–100.0)
PLATELETS: 432 10*3/uL (ref 150–440)
RBC: 3.62 MIL/uL — ABNORMAL LOW (ref 4.40–5.90)
RDW: 14.1 % (ref 11.5–14.5)
WBC: 10.6 10*3/uL (ref 3.8–10.6)

## 2016-09-18 NOTE — Progress Notes (Signed)
Subjective:   He reports that he feels better this morning. He is having less abdominal pain. He is mildly nauseated. He has nasogastric tube in place. He is passing gas and said he had a bowel movement. He is voiding spontaneously. His white blood cell count is down to 10,000. He continues on intravenous antibiotics.  Vital signs in last 24 hours: Temp:  [97.9 F (36.6 C)-98.9 F (37.2 C)] 97.9 F (36.6 C) (11/18 0526) Pulse Rate:  [74-82] 74 (11/18 0526) Resp:  [17-20] 17 (11/18 0526) BP: (125-148)/(66-81) 148/81 (11/18 0526) SpO2:  [94 %-96 %] 94 % (11/18 0526) Weight:  [74.9 kg (165 lb 1.6 oz)] 74.9 kg (165 lb 1.6 oz) (11/18 0500) Last BM Date: 09/17/16  Intake/Output from previous day: 11/17 0701 - 11/18 0700 In: 1288 [I.V.:979; IV Piggyback:309] Out: 850 [Urine:550; Emesis/NG output:300]  Exam:  His abdomen is soft mildly distended with some moderate right lower quadrant tenderness and guarding. Does not have any rebound. He has hypoactive but present bowel sounds. He's breathing comfortably with good excursion and no adventitious sounds.  Lab Results:  CBC  Recent Labs  09/16/16 1726 09/18/16 0533  WBC 11.0* 10.6  HGB 14.7 11.6*  HCT 45.4 33.7*  PLT 514* 432   CMP     Component Value Date/Time   NA 137 09/16/2016 1726   NA 135 (L) 07/31/2013 0439   K 4.2 09/16/2016 1726   K 4.5 07/31/2013 0439   CL 100 (L) 09/16/2016 1726   CL 104 07/31/2013 0439   CO2 27 09/16/2016 1726   CO2 27 07/31/2013 0439   GLUCOSE 105 (H) 09/16/2016 1726   GLUCOSE 116 (H) 07/31/2013 0439   BUN 14 09/16/2016 1726   BUN 17 07/31/2013 0439   CREATININE 1.06 09/16/2016 1726   CREATININE 0.95 07/31/2013 0439   CALCIUM 9.4 09/16/2016 1726   CALCIUM 9.6 07/31/2013 0439   PROT 7.6 09/16/2016 1726   PROT 8.1 07/31/2013 0439   ALBUMIN 4.0 09/16/2016 1726   ALBUMIN 4.5 07/31/2013 0439   AST 24 09/16/2016 1726   AST 35 07/31/2013 0439   ALT 30 09/16/2016 1726   ALT 30 07/31/2013 0439   ALKPHOS 54 09/16/2016 1726   ALKPHOS 69 07/31/2013 0439   BILITOT 0.6 09/16/2016 1726   BILITOT 0.4 07/31/2013 0439   GFRNONAA >60 09/16/2016 1726   GFRNONAA >60 07/31/2013 0439   GFRAA >60 09/16/2016 1726   GFRAA >60 07/31/2013 0439   PT/INR No results for input(s): LABPROT, INR in the last 72 hours.  Studies/Results: Dg Abdomen 1 View  Result Date: 09/17/2016 CLINICAL DATA:  NG placement EXAM: ABDOMEN - 1 VIEW COMPARISON:  09/16/2016 FINDINGS: The nasogastric tube extends well into the stomach. It curves on itself, with tip in the region of the fundus. IMPRESSION: Enteric tube extends well into the stomach. Electronically Signed   By: Andreas Newport M.D.   On: 09/17/2016 00:50   Ct Abdomen Pelvis W Contrast  Result Date: 09/16/2016 CLINICAL DATA:  Recent diagnosis of enterocolitis. Upper abdominal pain, nausea and constipation. Hiccups. EXAM: CT ABDOMEN AND PELVIS WITH CONTRAST TECHNIQUE: Multidetector CT imaging of the abdomen and pelvis was performed using the standard protocol following bolus administration of intravenous contrast. CONTRAST:  175mL ISOVUE-300 IOPAMIDOL (ISOVUE-300) INJECTION 61% COMPARISON:  CT abdomen and pelvis September 08, 2016 FINDINGS: Mild respiratory motion degraded examination. LOWER CHEST: Lung bases are clear. Included heart size is normal. No pericardial effusion. HEPATOBILIARY: 14 mm nonenhancing cyst RIGHT lobe of the liver,  liver is otherwise unremarkable. Normal gallbladder. PANCREAS: Normal. SPLEEN: Normal. ADRENALS/URINARY TRACT: Kidneys are orthotopic, demonstrating symmetric enhancement. No nephrolithiasis, hydronephrosis or solid renal masses. LEFT upper pole 12 mm cyst. Delayed imaging through the kidneys demonstrates symmetric prompt contrast excretion within the proximal urinary collecting system. Urinary bladder is partially distended and unremarkable. Normal adrenal glands. STOMACH/BOWEL: Small hiatal hernia. Enteric contrast has not yet reached  the distal small bowel. Circumferential terminal ileal wall thickening and inflammation resulting in the small bowel obstruction, small bowel dilated up 4.2 cm with distal small bowel feces. 5.1 cm central pelvis loop of stool-filled structure with peripheral enhancement, this does not and clearly communicate with the small bowel (axial 65/94). VASCULAR/LYMPHATIC: Aortoiliac vessels are normal in course and caliber, mild calcific atherosclerosis. No lymphadenopathy by CT size criteria. REPRODUCTIVE: Mild prostatomegaly invading the base of the bladder. OTHER: Small to moderate amount of low-density ascites is new. No intraperitoneal free air. MUSCULOSKELETAL: Nonacute. Small fat containing inguinal hernias. Mild thoracolumbar dextroscoliosis. Moderate bilateral sacroiliac osteoarthrosis, possible component sacroiliitis on the LEFT as there are erosions without bone formation. Moderate lumbar facet arthropathy. IMPRESSION: Small bowel obstruction secondary to terminal ileal inflammation and suspected contained 5 cm perforation/ abscess within central pelvis. Constellation of findings suggests Crohn's disease. Small to moderate amount of new ascites. Acute findings discussed with and reconfirmed by Lenor Derrick on 09/16/2016 at 9:54 pm. Electronically Signed   By: Elon Alas M.D.   On: 09/16/2016 21:56    Assessment/Plan: I've independently reviewed his CT scan. He does appear to have some sort of pelvic right lower quadrant abscess. The etiology of which is unclear. He does have some thickened small bowel which could be reactive or could be a primary process like Crohn's disease. I reviewed his previous CT scans and her not appear to be any evidence significant disease in the past. I cannot adequately identifies appendix so this certainly could be a ruptured appendicitis.  I talk to the patient detail. He continues to refuse surgery. He feels like the biggest risk is anesthetic risk and that he might  not wake up. I tried to comfort him with regard to that possibility outlining the very small possibility of an anesthesia complication. However, he continues to decline operative intervention and wants to continue IV antibiotics. I am very concerned about this course but we'll continue to follow him closely. If we see any change in his clinical presentation we will strongly recommend surgery or referral to a tertiary center. He is in agreement.

## 2016-09-18 NOTE — Progress Notes (Signed)
[place] at [place] NAME: Willie Rivas    MR#:  [id number]  DATE OF BIRTH:  11-28-1950 A patient is admitted for nausea, vomiting, abdominal pain, small bowel obstruction, started on IV fluids, IVadmitte antibiotics. admitted for nausea, vomiting, dmitted for right lower quadrant abdominal pain, abscess. Patient continued to refuse the nasal surgery.  ROS CONSTITUTIONAL: No fever, fatigue or weakness.  EYES: No blurred or double vision.  EARS, NOSE, AND THROAT: No tinnitus or ear pain.  RESPIRATORY: No cough, shortness of breath, wheezing or hemoptysis.  CARDIOVASCULAR: No chest pain, orthopnea, edema.  GASTROINTESTINAL: No nausea, vomiting, diarrhea or abdominal pain.  GENITOURINARY: No dysuria, hematuria.  ENDOCRINE: No polyuria, nocturia,  HEMATOLOGY: No anemia, easy bruising or bleeding SKIN: No rash or lesion. MUSCULOSKELETAL: No joint pain or arthritis.   NEUROLOGIC: No tingling, numbness, weakness.  PSYCHIATRY: No anxiety or depression.   DRUG ALLERGIES:  No Known Allergies  VITALS:  Blood pressure (!) 148/81, pulse 74, temperature 97.9 F (36.6 C), temperature source Oral, resp. rate 17, height 5\' 6"  (1.676 m), weight 74.9 kg (165 lb 1.6 oz), SpO2 94 %.  PHYSICAL EXAMINATION:  GENERAL:  65 y.o.-year-old patient lying in the bed with no acute distress.  EYES: Pupils equal, round, reactive to light and accommodation. No scleral icterus. Extraocular muscles intact.  HEENT: Head atraumatic, normocephalic. Oropharynx and nasopharynx clear.  NECK:  Supple, no jugular venous distention. No thyroid enlargement, no tenderness.  LUNGS: Normal breath sounds bilaterally, no wheezing, rales,rhonchi or crepitation. No use of accessory muscles of respiration.  CARDIOVASCULAR: S1, S2 normal. No murmurs, rubs, or gallops.  ABDOMEN: Soft, nontender, nondistended. Bowel sounds present. No organomegaly or mass.  EXTREMITIES: No pedal edema,  cyanosis, or clubbing.  NEUROLOGIC: Cranial nerves II through XII are intact. Muscle strength 5/5 in all extremities. Sensation intact. Gait not checked.  PSYCHIATRIC: The patient is alert and oriented x 3.  SKIN: No obvious rash, lesion, or ulcer.    LABORATORY PANEL:   CBC  Recent Labs Lab 09/18/16 0533  WBC 10.6  HGB 11.6*  HCT 33.7*  PLT 432   ------------------------------------------------------------------------------------------------------------------  Chemistries   Recent Labs Lab 09/16/16 1726  NA 137  K 4.2  CL 100*  CO2 27  GLUCOSE 105*  BUN 14  CREATININE 1.06  CALCIUM 9.4  AST 24  ALT 30  ALKPHOS 54  BILITOT 0.6   ------------------------------------------------------------------------------------------------------------------  Cardiac Enzymes  Recent Labs Lab 09/17/16 0143  TROPONINI <0.03   ------------------------------------------------------------------------------------------------------------------  RADIOLOGY:  Dg Abdomen 1 View  Result Date: 09/17/2016 CLINICAL DATA:  NG placement EXAM: ABDOMEN - 1 VIEW COMPARISON:  09/16/2016 FINDINGS: The nasogastric tube extends well into the stomach. It curves on itself, with tip in the region of the fundus. IMPRESSION: Enteric tube extends well into the stomach. Electronically Signed   By: Andreas Newport M.D.   On: 09/17/2016 00:50   Ct Abdomen Pelvis W Contrast  Result Date: 09/16/2016 CLINICAL DATA:  Recent diagnosis of enterocolitis. Upper abdominal pain, nausea and constipation. Hiccups. EXAM: CT ABDOMEN AND PELVIS WITH CONTRAST TECHNIQUE: Multidetector CT imaging of the abdomen and pelvis was performed using the standard protocol following bolus administration of intravenous contrast. CONTRAST:  156mL ISOVUE-300 IOPAMIDOL (ISOVUE-300) INJECTION 61% COMPARISON:  CT abdomen and pelvis September 08, 2016 FINDINGS: Mild respiratory motion degraded examination. LOWER CHEST: Lung bases are clear.  Included heart size is normal. No pericardial effusion. HEPATOBILIARY: 14 mm nonenhancing cyst RIGHT lobe of  the liver, liver is otherwise unremarkable. Normal gallbladder. PANCREAS: Normal. SPLEEN: Normal. ADRENALS/URINARY TRACT: Kidneys are orthotopic, demonstrating symmetric enhancement. No nephrolithiasis, hydronephrosis or solid renal masses. LEFT upper pole 12 mm cyst. Delayed imaging through the kidneys demonstrates symmetric prompt contrast excretion within the proximal urinary collecting system. Urinary bladder is partially distended and unremarkable. Normal adrenal glands. STOMACH/BOWEL: Small hiatal hernia. Enteric contrast has not yet reached the distal small bowel. Circumferential terminal ileal wall thickening and inflammation resulting in the small bowel obstruction, small bowel dilated up 4.2 cm with distal small bowel feces. 5.1 cm central pelvis loop of stool-filled structure with peripheral enhancement, this does not and clearly communicate with the small bowel (axial 65/94). VASCULAR/LYMPHATIC: Aortoiliac vessels are normal in course and caliber, mild calcific atherosclerosis. No lymphadenopathy by CT size criteria. REPRODUCTIVE: Mild prostatomegaly invading the base of the bladder. OTHER: Small to moderate amount of low-density ascites is new. No intraperitoneal free air. MUSCULOSKELETAL: Nonacute. Small fat containing inguinal hernias. Mild thoracolumbar dextroscoliosis. Moderate bilateral sacroiliac osteoarthrosis, possible component sacroiliitis on the LEFT as there are erosions without bone formation. Moderate lumbar facet arthropathy. IMPRESSION: Small bowel obstruction secondary to terminal ileal inflammation and suspected contained 5 cm perforation/ abscess within central pelvis. Constellation of findings suggests Crohn's disease. Small to moderate amount of new ascites. Acute findings discussed with and reconfirmed by Lenor Derrick on 09/16/2016 at 9:54 pm. Electronically Signed   By:  Elon Alas M.D.   On: 09/16/2016 21:56    EKG:   Orders placed or performed during the hospital encounter of 09/16/16  . EKG 12-Lead  . EKG 12-Lead  . ED EKG  . ED EKG    ASSESSMENT AND PLAN:  Abdominal pain, nausea, vomiting, small bowel obstruction;npo,Patient is on IV antibiotics, nothing by mouth, NG tube to LIS, surgery is following, they strongly recommend abscess drainage but patient continues to refuse Continue IV fluids, IV antibiotics, NG tube to suction, follow clinical course.     All the records are reviewed and case discussed with Care Management/Social Workerr. Management plans discussed with the patient, family and they are in agreement.  CODE STATUS:full  TOTAL TIME TAKING CARE OF THIS PATIENT:35 minutes.   POSSIBLE D/C IN 1-2DAYS, DEPENDING ON CLINICAL CONDITION.   Epifanio Lesches M.D on 09/18/2016 at 10:58 AM  Between 7am to 6pm - Pager - [phone]  After 6pm go to [email or web address] - password [email or web address] [facility]  [place] [facility]  Office  [phone]  CC: Primary care physician; No PCP Per Patient   Note: This dictation was prepared with Dragon dictation along with smaller phrase technology. Any transcriptional errors that result from this process are unintentional.

## 2016-09-18 NOTE — Plan of Care (Signed)
Problem: Pain Managment: Goal: General experience of comfort will improve Outcome: Progressing Pt is not experiencing any pain.   Problem: Nutrition: Goal: Adequate nutrition will be maintained Outcome: Not Progressing Pt is still currently NPO status.

## 2016-09-19 ENCOUNTER — Encounter: Payer: Self-pay | Admitting: Radiology

## 2016-09-19 ENCOUNTER — Inpatient Hospital Stay: Payer: Self-pay

## 2016-09-19 LAB — CBC
HEMATOCRIT: 33.6 % — AB (ref 40.0–52.0)
Hemoglobin: 11.8 g/dL — ABNORMAL LOW (ref 13.0–18.0)
MCH: 33.2 pg (ref 26.0–34.0)
MCHC: 35.3 g/dL (ref 32.0–36.0)
MCV: 94.1 fL (ref 80.0–100.0)
PLATELETS: 480 10*3/uL — AB (ref 150–440)
RBC: 3.57 MIL/uL — ABNORMAL LOW (ref 4.40–5.90)
RDW: 14.4 % (ref 11.5–14.5)
WBC: 6 10*3/uL (ref 3.8–10.6)

## 2016-09-19 LAB — BASIC METABOLIC PANEL
ANION GAP: 7 (ref 5–15)
BUN: 5 mg/dL — ABNORMAL LOW (ref 6–20)
CALCIUM: 8.5 mg/dL — AB (ref 8.9–10.3)
CO2: 27 mmol/L (ref 22–32)
CREATININE: 1.05 mg/dL (ref 0.61–1.24)
Chloride: 106 mmol/L (ref 101–111)
GFR calc non Af Amer: >60 mL/min (ref >60)
Glucose, Bld: 94 mg/dL (ref 65–99)
Potassium: 3.4 mmol/L — ABNORMAL LOW (ref 3.5–5.1)
SODIUM: 140 mmol/L (ref 135–145)

## 2016-09-19 MED ORDER — KCL IN DEXTROSE-NACL 40-5-0.45 MEQ/L-%-% IV SOLN
INTRAVENOUS | Status: DC
  Administered 2016-09-19 – 2016-09-20 (×3): via INTRAVENOUS
  Filled 2016-09-19 (×5): qty 1000

## 2016-09-19 MED ORDER — IOPAMIDOL (ISOVUE-300) INJECTION 61%
15.0000 mL | INTRAVENOUS | Status: AC
  Administered 2016-09-19: 15 mL via ORAL

## 2016-09-19 MED ORDER — LABETALOL HCL 5 MG/ML IV SOLN
10.0000 mg | INTRAVENOUS | Status: DC | PRN
  Administered 2016-09-19: 10 mg via INTRAVENOUS
  Filled 2016-09-19: qty 4

## 2016-09-19 MED ORDER — IOPAMIDOL (ISOVUE-300) INJECTION 61%
100.0000 mL | Freq: Once | INTRAVENOUS | Status: AC | PRN
  Administered 2016-09-19: 100 mL via INTRAVENOUS

## 2016-09-19 NOTE — Progress Notes (Signed)
NGT removed per MD order.

## 2016-09-19 NOTE — Progress Notes (Signed)
Notified dr.willis of pt inc b/p. Prn orders placed with parameters. Cont to monitor.

## 2016-09-19 NOTE — Progress Notes (Signed)
[place] at [place] NAME: Willie Rivas    MR#:  [id number]  DATE OF BIRTH:  Oct 03, 1951  Admitted  for abdominal pain, nausea, vomiting, intestinal obstruction: Today patient feels better now abdominal pain. No nausea or vomiting. NG tube to be removed as per surgical recommendation. Abdominal CAT scan to be repeated. Patient also found to have right lower lobe Quadrant abscess ,unable to see if it's appendix out intestinal abscess.    AROS CONSTITUTIONAL: No fever, fatigue or weakness.  EYES: No blurred or double vision.  EARS, NOSE, AND THROAT: No tinnitus or ear pain.  RESPIRATORY: No cough, shortness of breath, wheezing or hemoptysis.  CARDIOVASCULAR: No chest pain, orthopnea, edema.  GASTROINTESTINAL: No nausea, vomiting, diarrhea or abdominal pain.  GENITOURINARY: No dysuria, hematuria.  ENDOCRINE: No polyuria, nocturia,  HEMATOLOGY: No anemia, easy bruising or bleeding SKIN: No rash or lesion. MUSCULOSKELETAL: No joint pain or arthritis.   NEUROLOGIC: No tingling, numbness, weakness.  PSYCHIATRY: No anxiety or depression.   DRUG ALLERGIES:  No Known Allergies  VITALS:  Blood pressure (!) 160/84, pulse 71, temperature 98 F (36.7 C), temperature source Oral, resp. rate 17, height 5\' 6"  (1.676 m), weight 73.2 kg (161 lb 4.8 oz), SpO2 95 %.  PHYSICAL EXAMINATION:  GENERAL:  65 y.o.-year-old patient lying in the bed with no acute distress.  EYES: Pupils equal, round, reactive to light and accommodation. No scleral icterus. Extraocular muscles intact.  HEENT: Head atraumatic, normocephalic. Oropharynx and nasopharynx clear.  NECK:  Supple, no jugular venous distention. No thyroid enlargement, no tenderness.  LUNGS: Normal breath sounds bilaterally, no wheezing, rales,rhonchi or crepitation. No use of accessory muscles of respiration.  CARDIOVASCULAR: S1, S2 normal. No murmurs, rubs, or gallops.  ABDOMEN: Soft, nontender,  nondistended. Bowel sounds present. No organomegaly or mass.  EXTREMITIES: No pedal edema, cyanosis, or clubbing.  NEUROLOGIC: Cranial nerves II through XII are intact. Muscle strength 5/5 in all extremities. Sensation intact. Gait not checked.  PSYCHIATRIC: The patient is alert and oriented x 3.  SKIN: No obvious rash, lesion, or ulcer.    LABORATORY PANEL:   CBC  Recent Labs Lab 09/19/16 0556  WBC 6.0  HGB 11.8*  HCT 33.6*  PLT 480*   ------------------------------------------------------------------------------------------------------------------  Chemistries   Recent Labs Lab 09/16/16 1726 09/19/16 0556  NA 137 140  K 4.2 3.4*  CL 100* 106  CO2 27 27  GLUCOSE 105* 94  BUN 14 5*  CREATININE 1.06 1.05  CALCIUM 9.4 8.5*  AST 24  --   ALT 30  --   ALKPHOS 54  --   BILITOT 0.6  --    ------------------------------------------------------------------------------------------------------------------  Cardiac Enzymes  Recent Labs Lab 09/17/16 0143  TROPONINI <0.03   ------------------------------------------------------------------------------------------------------------------  RADIOLOGY:  No results found.  EKG:   Orders placed or performed during the hospital encounter of 09/16/16  . EKG 12-Lead  . EKG 12-Lead  . ED EKG  . ED EKG    ASSESSMENT AND PLAN:  Abdominal pain, nausea, vomiting, small bowel obstruction;npo,Patient is on IV antibiotics, nothing by mouth.  Ng tube is  removed by surgery, abdominal CAT scan to be repeated. WBC is normal. On IV antibiotics, IV fluids, IV nausea medicines, IV PPIs. I spoke with him at length, patient says that the he wanted to talk to his daughter before deciding on any abscess drainage.  #2 essential hypertension: Start on BP medication All the records are reviewed and case discussed with  Care Management/Social Workerr. Management plans discussed with the patient, family and they are in agreement.  CODE  STATUS:full  TOTAL TIME TAKING CARE OF THIS PATIENT:35 minutes.   POSSIBLE D/C IN 1-2DAYS, DEPENDING ON CLINICAL CONDITION.   Epifanio Lesches M.D on 09/19/2016 at 10:32 AM  Between 7am to 6pm - Pager - [phone]  After 6pm go to [email or web address] - password [email or web address] [facility]  [place] [facility]  Office  [phone]  CC: Primary care physician; No PCP Per Patient   Note: This dictation was prepared with Dragon dictation along with smaller phrase technology. Any transcriptional errors that result from this process are unintentional.

## 2016-09-19 NOTE — Progress Notes (Signed)
Spoke with MD regarding pt refusing to drink second bottle of contrast. MD advised to wait an hour before scan.

## 2016-09-19 NOTE — Progress Notes (Signed)
Subjective:   He states that he is feeling better. He remains afebrile. His white blood cell count is down to 6000. He denies any abdominal pain. Has passed some gas but has had no bowel movement as yet. He relates that he's feeling much better overall although I suspect some of that is his desire to avoid surgery. He's not been out of bed other than the sitting in a chair at present.  Vital signs in last 24 hours: Temp:  [97.7 F (36.5 C)-99.1 F (37.3 C)] 98 F (36.7 C) (11/19 0526) Pulse Rate:  [71-78] 71 (11/19 0526) Resp:  [16-20] 17 (11/19 0526) BP: (140-170)/(74-94) 160/84 (11/19 0526) SpO2:  [94 %-95 %] 95 % (11/19 0526) Weight:  [73.2 kg (161 lb 4.8 oz)] 73.2 kg (161 lb 4.8 oz) (11/19 0500) Last BM Date: 09/17/16  Intake/Output from previous day: 11/18 0701 - 11/19 0700 In: 2198 [I.V.:2005; IV Piggyback:193] Out: [id number] [Urine:1475; Emesis/NG output:350]  Exam:  His abdomen is soft with no abdominal tenderness and mild distention. He has active bowel sounds. His chest is clear and he has no adventitious sounds.  Lab Results:  CBC  Recent Labs  09/18/16 0533 09/19/16 0556  WBC 10.6 6.0  HGB 11.6* 11.8*  HCT 33.7* 33.6*  PLT 432 480*   CMP     Component Value Date/Time   NA 140 09/19/2016 0556   NA 135 (L) 07/31/2013 0439   K 3.4 (L) 09/19/2016 0556   K 4.5 07/31/2013 0439   CL 106 09/19/2016 0556   CL 104 07/31/2013 0439   CO2 27 09/19/2016 0556   CO2 27 07/31/2013 0439   GLUCOSE 94 09/19/2016 0556   GLUCOSE 116 (H) 07/31/2013 0439   BUN 5 (L) 09/19/2016 0556   BUN 17 07/31/2013 0439   CREATININE 1.05 09/19/2016 0556   CREATININE 0.95 07/31/2013 0439   CALCIUM 8.5 (L) 09/19/2016 0556   CALCIUM 9.6 07/31/2013 0439   PROT 7.6 09/16/2016 1726   PROT 8.1 07/31/2013 0439   ALBUMIN 4.0 09/16/2016 1726   ALBUMIN 4.5 07/31/2013 0439   AST 24 09/16/2016 1726   AST 35 07/31/2013 0439   ALT 30 09/16/2016 1726   ALT 30 07/31/2013 0439   ALKPHOS 54 09/16/2016  1726   ALKPHOS 69 07/31/2013 0439   BILITOT 0.6 09/16/2016 1726   BILITOT 0.4 07/31/2013 0439   GFRNONAA >60 09/19/2016 0556   GFRNONAA >60 07/31/2013 0439   GFRAA >60 09/19/2016 0556   GFRAA >60 07/31/2013 0439   PT/INR No results for input(s): LABPROT, INR in the last 72 hours.  Studies/Results: No results found.  Assessment/Plan: I'm going to repeat his CT scan today to look this potential abscesses right lower quadrant. If it has not improved I still think this gentleman would do better with surgical intervention. I went over the options with him again today. We will look at the results of the CT scan and try to make a decision. Today he has adamantly refused to consider any sort of intervention.

## 2016-09-19 NOTE — Plan of Care (Signed)
Problem: Activity: Goal: Risk for activity intolerance will decrease Outcome: Not Progressing Pt has been encouraged to ambulate, but he has only ambulated in the room.

## 2016-09-20 DIAGNOSIS — K3533 Acute appendicitis with perforation and localized peritonitis, with abscess: Secondary | ICD-10-CM

## 2016-09-20 DIAGNOSIS — K353 Acute appendicitis with localized peritonitis: Secondary | ICD-10-CM

## 2016-09-20 LAB — CBC
HCT: 33.7 % — ABNORMAL LOW (ref 40.0–52.0)
Hemoglobin: 11.6 g/dL — ABNORMAL LOW (ref 13.0–18.0)
MCH: 31.9 pg (ref 26.0–34.0)
MCHC: 34.6 g/dL (ref 32.0–36.0)
MCV: 92.3 fL (ref 80.0–100.0)
PLATELETS: 478 10*3/uL — AB (ref 150–440)
RBC: 3.65 MIL/uL — AB (ref 4.40–5.90)
RDW: 14.4 % (ref 11.5–14.5)
WBC: 4.8 10*3/uL (ref 3.8–10.6)

## 2016-09-20 LAB — BASIC METABOLIC PANEL
Anion gap: 5 (ref 5–15)
CO2: 26 mmol/L (ref 22–32)
CREATININE: 0.96 mg/dL (ref 0.61–1.24)
Calcium: 8.6 mg/dL — ABNORMAL LOW (ref 8.9–10.3)
Chloride: 106 mmol/L (ref 101–111)
GFR calc Af Amer: >60 mL/min (ref >60)
Glucose, Bld: 105 mg/dL — ABNORMAL HIGH (ref 65–99)
Potassium: 3.7 mmol/L (ref 3.5–5.1)
SODIUM: 137 mmol/L (ref 135–145)

## 2016-09-20 NOTE — Progress Notes (Signed)
[facility] [place] NAME: Willie Rivas    MR#:  [id number]  DATE OF BIRTH:  11/14/1950  Admitted  for abdominal pain, nausea, vomiting, intestinal obstruction:  patient feels better. Denies abd pain, nausea, refusing abscess drainage Abdominal CAT scan  repeated.    AROS CONSTITUTIONAL: No fever, fatigue or weakness.  EYES: No blurred or double vision.  EARS, NOSE, AND THROAT: No tinnitus or ear pain.  RESPIRATORY: No cough, shortness of breath, wheezing or hemoptysis.  CARDIOVASCULAR: No chest pain, orthopnea, edema.  GASTROINTESTINAL: No nausea, vomiting, diarrhea or abdominal pain.  GENITOURINARY: No dysuria, hematuria.  ENDOCRINE: No polyuria, nocturia,  HEMATOLOGY: No anemia, easy bruising or bleeding SKIN: No rash or lesion. MUSCULOSKELETAL: No joint pain or arthritis.   NEUROLOGIC: No tingling, numbness, weakness.  PSYCHIATRY: No anxiety or depression.   DRUG ALLERGIES:  No Known Allergies  VITALS:  Blood pressure (!) 152/89, pulse 72, temperature 98.5 F (36.9 C), temperature source Oral, resp. rate 18, height 5\' 6"  (1.676 m), weight 72.7 kg (160 lb 4.8 oz), SpO2 96 %.  PHYSICAL EXAMINATION:  GENERAL:  65 y.o.-year-old patient lying in the bed with no acute distress.  EYES: Pupils equal, round, reactive to light and accommodation. No scleral icterus. Extraocular muscles intact.  HEENT: Head atraumatic, normocephalic. Oropharynx and nasopharynx clear.  NECK:  Supple, no jugular venous distention. No thyroid enlargement, no tenderness.  LUNGS: Normal breath sounds bilaterally, no wheezing, rales,rhonchi or crepitation. No use of accessory muscles of respiration.  CARDIOVASCULAR: S1, S2 normal. No murmurs, rubs, or gallops.  ABDOMEN: Soft, nontender, nondistended. Bowel sounds present. No organomegaly or mass.  EXTREMITIES: No pedal edema, cyanosis, or clubbing.  NEUROLOGIC: Cranial nerves II through XII are intact.  Muscle strength 5/5 in all extremities. Sensation intact. Gait not checked.  PSYCHIATRIC: The patient is alert and oriented x 3.  SKIN: No obvious rash, lesion, or ulcer.    LABORATORY PANEL:   CBC  Recent Labs Lab 09/20/16 0537  WBC 4.8  HGB 11.6*  HCT 33.7*  PLT 478*   ------------------------------------------------------------------------------------------------------------------  Chemistries   Recent Labs Lab 09/16/16 1726  09/20/16 0537  NA 137  < > 137  K 4.2  < > 3.7  CL 100*  < > 106  CO2 27  < > 26  GLUCOSE 105*  < > 105*  BUN 14  < > <5*  CREATININE 1.06  < > 0.96  CALCIUM 9.4  < > 8.6*  AST 24  --   --   ALT 30  --   --   ALKPHOS 54  --   --   BILITOT 0.6  --   --   < > = values in this interval not displayed. ------------------------------------------------------------------------------------------------------------------  Cardiac Enzymes  Recent Labs Lab 09/17/16 0143  TROPONINI <0.03   ------------------------------------------------------------------------------------------------------------------  RADIOLOGY:  Ct Abdomen Pelvis W Contrast  Result Date: 09/19/2016 CLINICAL DATA:  Follow-up abscess.  Abdominal pain. EXAM: CT ABDOMEN AND PELVIS WITH CONTRAST TECHNIQUE: Multidetector CT imaging of the abdomen and pelvis was performed using the standard protocol following bolus administration of intravenous contrast. CONTRAST:  1103mL ISOVUE-300 IOPAMIDOL (ISOVUE-300) INJECTION 61% COMPARISON:  CT scan September 16, 2016 FINDINGS: Lower chest: New small bilateral pleural effusions. Opacities associated with the effusions are favored to represent atelectasis rather than pneumonia. Recommend attention on follow-up. The lung bases are otherwise unchanged. Hepatobiliary: A cyst is again seen in the inferior right hepatic lobe, unchanged. Mild  hepatic steatosis. No other liver lesions identified. High attenuation material in the gallbladder is consistent with  stones or sludge. The portal vein is normal. Pancreas: Unremarkable. No pancreatic ductal dilatation or surrounding inflammatory changes. Spleen: Small low-attenuation lesions in the spleen are unchanged. These are probably small cysts or hemangiomas. Adrenals/Urinary Tract: There is a cyst in the left kidney. No hydronephrosis or perinephric stranding. The bladder is unchanged unremarkable. Stomach/Bowel: The stomach is normal in appearance. The proximal small bowel is normal. The terminal ileum and distal loops of ileum remain thickened consistent with inflammation. The degree of dilatation has improved but the degree of wall thickening has worsened. Colonic diverticulosis is seen without diverticulitis. The appendix extends into the pelvis and terminates in the region of the pelvic abscess. However, the degree and distribution of small bowel thickening suggests the process is due to a small bowel process rather than arising from the appendix. Vascular/Lymphatic: Atherosclerotic changes seen in the non aneurysmal aorta, iliac vessels, and femoral vessels. No adenopathy. Reproductive: Prostate calcifications. Other: The abscess in the pelvis described on the previous study persists but is smaller in the interval measuring 2.8 by 2.2 cm today versus is 4.6 by 3.8 cm previously. This is seen on series 2, image 60. No free air. There is a small amount of free fluid in the pelvis. Musculoskeletal: No bony changes in the interval. IMPRESSION: 1. Persistent abnormal loops of small bowel in the pelvis, particularly to the right, with wall thickening and mild distention. The wall thickening has worsened but the degree of distention has improved. There is an abscess in the right side of pelvis which is smaller in the interval but persists. The findings could be due to Crohn's disease. 2. The appendix terminates near the abscess in the pelvis. However, given the distribution of small bowel wall thickening throughout the  pelvis, the abnormality in the pelvis is thought to be small bowel in origin rather than appendiceal. 3. New small bilateral pleural effusions and underlying opacities. 4. Atherosclerotic change in the aorta. Electronically Signed   By: Dorise Bullion III M.D   On: 09/19/2016 13:14    EKG:   Orders placed or performed during the hospital encounter of 09/16/16  . EKG 12-Lead  . EKG 12-Lead  . ED EKG  . ED EKG    ASSESSMENT AND PLAN:  Abdominal pain, nausea, vomiting, small bowel obstruction;npo,Patient is on IV antibiotics, nothing by mouth.  Ng tube is  removed by surgery abdominal CAT scan Repeated, reveals reduction in the size of the abdominal abscess, finding could be from Crohn's disease  WBC is normal.  On IV antibiotics, IV fluids, IV nausea medicines, IV PPIs. Tolerating full liquid diet Refusing abdominal abscess drainage and GI consult  I spoke with him at length, patient refusing care at this time, discussed with Dr. Burt Knack not comfortable to discharge patient from surgery standpoint GI consult is placed for possible Crohn's disease which is pending at this time, patient is refusing to see GI consult  #2 essential hypertension: Started on BP medication Titrate as needed All the records are reviewed and case discussed with Care Management/Social Workerr. Management plans discussed with the patient, call placed and a voicemail left to marquis lee to call us back  RN Zenita Was Present during the Discussion with the Patient    CODE STATUS:full  TOTAL TIME TAKING CARE OF THIS PATIENT:35 minutes.   POSSIBLE D/C IN 1-2DAYS, DEPENDING ON CLINICAL CONDITION.   Nicholes Mango M.D on 09/20/2016  at 12:12 PM  Between 7am to 6pm - Pager - [phone]  After 6pm go to [email or web address] - password [email or web address] [facility]  [facility]  Office  [phone]  CC: Primary care physician; No PCP Per Patient   Note: This dictation was prepared with Dragon dictation along with  smaller phrase technology. Any transcriptional errors that result from this process are unintentional.

## 2016-09-20 NOTE — Progress Notes (Signed)
Against medical advice, patient signed AMA paper and decided he did not want to stay. IV site removed Attending MD, and surgical consultation made aware.  Pt stated "If it gets worse I will come back and have a surgery then, right now I feel fine."

## 2016-09-20 NOTE — Care Management (Signed)
Patient self pay.  Notified patient left AMA.  No resources provided.

## 2016-09-20 NOTE — Progress Notes (Signed)
CC: Right lower quadrant abscess Subjective: This a patient with right lower quadrant pain and a workup suggesting an abscess the right lower quadrant possibly involving the tip of the abscess but more likely involving small bowel and repeated the CT scan suggested this could be a Crohn's abscess. Patient feels better today wants to go home. He has no nausea or vomiting. No fevers or chills  Objective: Vital signs in last 24 hours: Temp:  [98.5 F (36.9 C)-98.6 F (37 C)] 98.5 F (36.9 C) (11/20 0459) Pulse Rate:  [50-75] 72 (11/20 0459) Resp:  [16-20] 18 (11/20 0459) BP: (152-167)/(89-100) 152/89 (11/20 0459) SpO2:  [95 %-97 %] 96 % (11/20 0459) Weight:  [160 lb 4.8 oz (72.7 kg)] 160 lb 4.8 oz (72.7 kg) (11/20 0341) Last BM Date: 09/19/16  Intake/Output from previous day: 11/19 0701 - 11/20 0700 In: 3483 [P.O.:850; I.V.:2228; IV Piggyback:405] Out: 2000 [Urine:2000] Intake/Output this shift: Total I/O In: -  Out: 100 [Urine:100]  Physical exam:  Awake alert and oriented no acute distress abdomen is soft distended tympanitic minimally tender in the right lower quadrant.  Lab Results: CBC   Recent Labs  09/19/16 0556 09/20/16 0537  WBC 6.0 4.8  HGB 11.8* 11.6*  HCT 33.6* 33.7*  PLT 480* 478*   BMET  Recent Labs  09/19/16 0556 09/20/16 0537  NA 140 137  K 3.4* 3.7  CL 106 106  CO2 27 26  GLUCOSE 94 105*  BUN 5* <5*  CREATININE 1.05 0.96  CALCIUM 8.5* 8.6*   PT/INR No results for input(s): LABPROT, INR in the last 72 hours. ABG No results for input(s): PHART, HCO3 in the last 72 hours.  Invalid input(s): PCO2, PO2  Studies/Results: Ct Abdomen Pelvis W Contrast  Result Date: 09/19/2016 CLINICAL DATA:  Follow-up abscess.  Abdominal pain. EXAM: CT ABDOMEN AND PELVIS WITH CONTRAST TECHNIQUE: Multidetector CT imaging of the abdomen and pelvis was performed using the standard protocol following bolus administration of intravenous contrast. CONTRAST:  132mL  ISOVUE-300 IOPAMIDOL (ISOVUE-300) INJECTION 61% COMPARISON:  CT scan September 16, 2016 FINDINGS: Lower chest: New small bilateral pleural effusions. Opacities associated with the effusions are favored to represent atelectasis rather than pneumonia. Recommend attention on follow-up. The lung bases are otherwise unchanged. Hepatobiliary: A cyst is again seen in the inferior right hepatic lobe, unchanged. Mild hepatic steatosis. No other liver lesions identified. High attenuation material in the gallbladder is consistent with stones or sludge. The portal vein is normal. Pancreas: Unremarkable. No pancreatic ductal dilatation or surrounding inflammatory changes. Spleen: Small low-attenuation lesions in the spleen are unchanged. These are probably small cysts or hemangiomas. Adrenals/Urinary Tract: There is a cyst in the left kidney. No hydronephrosis or perinephric stranding. The bladder is unchanged unremarkable. Stomach/Bowel: The stomach is normal in appearance. The proximal small bowel is normal. The terminal ileum and distal loops of ileum remain thickened consistent with inflammation. The degree of dilatation has improved but the degree of wall thickening has worsened. Colonic diverticulosis is seen without diverticulitis. The appendix extends into the pelvis and terminates in the region of the pelvic abscess. However, the degree and distribution of small bowel thickening suggests the process is due to a small bowel process rather than arising from the appendix. Vascular/Lymphatic: Atherosclerotic changes seen in the non aneurysmal aorta, iliac vessels, and femoral vessels. No adenopathy. Reproductive: Prostate calcifications. Other: The abscess in the pelvis described on the previous study persists but is smaller in the interval measuring 2.8 by 2.2 cm today  versus is 4.6 by 3.8 cm previously. This is seen on series 2, image 60. No free air. There is a small amount of free fluid in the pelvis. Musculoskeletal: No  bony changes in the interval. IMPRESSION: 1. Persistent abnormal loops of small bowel in the pelvis, particularly to the right, with wall thickening and mild distention. The wall thickening has worsened but the degree of distention has improved. There is an abscess in the right side of pelvis which is smaller in the interval but persists. The findings could be due to Crohn's disease. 2. The appendix terminates near the abscess in the pelvis. However, given the distribution of small bowel wall thickening throughout the pelvis, the abnormality in the pelvis is thought to be small bowel in origin rather than appendiceal. 3. New small bilateral pleural effusions and underlying opacities. 4. Atherosclerotic change in the aorta. Electronically Signed   By: Dorise Bullion III M.D   On: 09/19/2016 13:14    Anti-infectives: Anti-infectives    Start     Dose/Rate Route Frequency Ordered Stop   09/17/16 1400  piperacillin-tazobactam (ZOSYN) IVPB 3.375 g     3.375 g 12.5 mL/hr over 240 Minutes Intravenous Every 8 hours 09/17/16 1142     09/17/16 0600  piperacillin-tazobactam (ZOSYN) IVPB 4.5 g  Status:  Discontinued     4.5 g 25 mL/hr over 240 Minutes Intravenous Every 8 hours 09/17/16 0238 09/17/16 1142   09/17/16 0400  metroNIDAZOLE (FLAGYL) IVPB 500 mg     500 mg 100 mL/hr over 60 Minutes Intravenous Every 8 hours 09/17/16 0230     09/16/16 2215  piperacillin-tazobactam (ZOSYN) IVPB 3.375 g     3.375 g 100 mL/hr over 30 Minutes Intravenous  Once 09/16/16 2203 09/16/16 2358      Assessment/Plan:  White blood cell count remains elevated over 11. CT scan is reviewed. Apparently the patient has refused this CT-guided drainage or surgical intervention and his abscess is slightly disc decreased at this point the question of appendicitis versus Crohn's is always difficult to determine but I will ask Dr. Lucilla Lame to see the patient for alternative considerations prior to his being discharged on oral  antibiotics.  Florene Glen, MD, FACS  09/20/2016

## 2016-09-20 NOTE — Plan of Care (Signed)
Problem: Health Behavior/Discharge Planning: Goal: Ability to manage health-related needs will improve Outcome: Not Met (add Reason) Patient left AMA

## 2016-09-24 NOTE — Discharge Summary (Signed)
Willie Rivas is a 65 year old male admitted to the emergency department with the complaint of abdominal pain on 09/17/2016 Please review history and physical for complete details  HPI: The patient with past medical history of hypertension presents to the emergency department complaining of abdominal pain. He states that his abdomen has been hurting for approximately one week during which time he has had multiple episodes of nausea and vomiting. He admits to some episodes of hematochezia as well. The patient states that he's had some diarrhea during the last week but now states that he is unable to have a good bowel movement. After narcotic medication in the emergency department the patient denies abdominal pain. CT of the abdomen showed inflammation possibly consistent with Crohn's disease as well as partial small bowel obstruction and intestinal abscess. Surgery was consulted who stated that surgical intervention wasn't necessary at this time but that he would need IV antibiotics which prompted emergency department staff to call the hospitalist service for admission.  Hospital course patient was seen and evaluated by Dr. Burt Knack surgery regarding right lower quadrant abdominal pain and abscess and was provided with IV antibiotics repeated CAT scan of the abdomen has revealed abdominal abscess and could be a Crohn's abscess he has recommended CT-guided drainage or surgical intervention which was refused by the patient GI consult was placed but that was also refused by the patient patient left the hospital [place] after signing [place] paperwork on 09/20/2016 IV site was removed by the RN,

## 2017-01-02 ENCOUNTER — Encounter: Payer: Self-pay | Admitting: Emergency Medicine

## 2017-01-02 ENCOUNTER — Emergency Department
Admission: EM | Admit: 2017-01-02 | Discharge: 2017-01-02 | Disposition: A | Discharge status: Home / Self Care | Payer: Medicare Other | Attending: Emergency Medicine | Admitting: Emergency Medicine

## 2017-01-02 DIAGNOSIS — I10 Essential (primary) hypertension: Secondary | ICD-10-CM | POA: Diagnosis not present

## 2017-01-02 DIAGNOSIS — R42 Dizziness and giddiness: Secondary | ICD-10-CM

## 2017-01-02 DIAGNOSIS — E860 Dehydration: Secondary | ICD-10-CM | POA: Insufficient documentation

## 2017-01-02 DIAGNOSIS — R55 Syncope and collapse: Secondary | ICD-10-CM | POA: Diagnosis present

## 2017-01-02 DIAGNOSIS — Z87891 Personal history of nicotine dependence: Secondary | ICD-10-CM | POA: Diagnosis not present

## 2017-01-02 LAB — URINALYSIS, COMPLETE (UACMP) WITH MICROSCOPIC
BACTERIA UA: NONE SEEN
BILIRUBIN URINE: NEGATIVE
Glucose, UA: NEGATIVE mg/dL
KETONES UR: NEGATIVE mg/dL
LEUKOCYTES UA: NEGATIVE
Nitrite: NEGATIVE
PH: 6 (ref 5.0–8.0)
PROTEIN: NEGATIVE mg/dL
Specific Gravity, Urine: 1.019 (ref 1.005–1.030)

## 2017-01-02 LAB — BASIC METABOLIC PANEL
Anion gap: 6 (ref 5–15)
BUN: 14 mg/dL (ref 6–20)
CHLORIDE: 103 mmol/L (ref 101–111)
CO2: 30 mmol/L (ref 22–32)
CREATININE: 0.84 mg/dL (ref 0.61–1.24)
Calcium: 9.6 mg/dL (ref 8.9–10.3)
GFR calc non Af Amer: >60 mL/min (ref >60)
Glucose, Bld: 111 mg/dL — ABNORMAL HIGH (ref 65–99)
POTASSIUM: 4.2 mmol/L (ref 3.5–5.1)
SODIUM: 139 mmol/L (ref 135–145)

## 2017-01-02 LAB — CBC
HEMATOCRIT: 43.6 % (ref 40.0–52.0)
Hemoglobin: 14.8 g/dL (ref 13.0–18.0)
MCH: 31.7 pg (ref 26.0–34.0)
MCHC: 34 g/dL (ref 32.0–36.0)
MCV: 93.3 fL (ref 80.0–100.0)
PLATELETS: 269 10*3/uL (ref 150–440)
RBC: 4.68 MIL/uL (ref 4.40–5.90)
RDW: 14.6 % — ABNORMAL HIGH (ref 11.5–14.5)
WBC: 6.5 10*3/uL (ref 3.8–10.6)

## 2017-01-02 LAB — TROPONIN I: Troponin I: <0.03 ng/mL (ref <0.03)

## 2017-01-02 MED ORDER — SODIUM CHLORIDE 0.9 % IV BOLUS (SEPSIS)
500.0000 mL | Freq: Once | INTRAVENOUS | Status: AC
  Administered 2017-01-02: 500 mL via INTRAVENOUS

## 2017-01-02 NOTE — ED Provider Notes (Addendum)
[facility] Emergency Department Provider Note  ____________________________________________   I have reviewed the triage vital signs and the nursing notes.   HISTORY  Chief Complaint Near Syncope and Hypertension    HPI Willie Rivas is a 66 y.o. male who states that he woke up this morning and had an egg at around 9:00, and a "little bit of water" at that time. To  demonstrate this he held up his fingers about 2 inches apart, and then he spent the rest of the day underneath his car trying to work on the transmission. When he stood up after being under the car for several hours, he felt lightheaded. He feels fine now. The patient states he had no chest pain or short of breath no nausea no vomiting no antecedent symptoms of any variety. For complete fine all day long until he stood up too quickly. Denies any abdominal pain, melena bright red blood per rectum hematemesis dysuria or urinary frequency or any other complaints.      Past Medical History:  Diagnosis Date  . Hypertension     Patient Active Problem List   Diagnosis Date Noted  . Acute appendicitis with peritoneal abscess   . Small bowel obstruction     History reviewed. No pertinent surgical history.  Prior to Admission medications   Medication Sig Start Date End Date Taking? Authorizing Provider  ciprofloxacin (CIPRO) 500 MG tablet Take 1 tablet (500 mg total) by mouth 2 (two) times daily. 09/09/16   Paulette Blanch, MD  HYDROcodone-acetaminophen (NORCO) 5-325 MG tablet Take 1 tablet by mouth every 6 (six) hours as needed for moderate pain. 09/09/16   Paulette Blanch, MD  metroNIDAZOLE (FLAGYL) 500 MG tablet Take 1 tablet (500 mg total) by mouth 2 (two) times daily. 09/09/16   Paulette Blanch, MD    Allergies Patient has no known allergies.  Family History  Problem Relation Age of Onset  . Heart disease Mother     Social History Social History  Substance Use Topics  . Smoking status: Former Smoker     Quit date: 09/2015  . Smokeless tobacco: Never Used  . Alcohol use No    Review of Systems Constitutional: No fever/chills Eyes: No visual changes. ENT: No sore throat. No stiff neck no neck pain Cardiovascular: Denies chest pain. Respiratory: Denies shortness of breath. Gastrointestinal:   no vomiting.  No diarrhea.  No constipation. Genitourinary: Negative for dysuria. Musculoskeletal: Negative lower extremity swelling Skin: Negative for rash. Neurological: Negative for severe headaches, focal weakness or numbness. 10-point ROS otherwise negative.  ____________________________________________   PHYSICAL EXAM:  VITAL SIGNS: ED Triage Vitals [01/02/17 1424]  Enc Vitals Group     BP (!) 157/91     Pulse Rate 71     Resp 18     Temp 97.4 F (36.3 C)     Temp Source Oral     SpO2 99 %     Weight 162 lb (73.5 kg)     Height 5\' 6"  (1.676 m)     Head Circumference      Peak Flow      Pain Score      Pain Loc      Pain Edu?      Excl. in [place]?     Constitutional: Alert and oriented. Well appearing and in no acute distress. Eyes: Conjunctivae are normal. PERRL. EOMI. Head: Atraumatic. Nose: No congestion/rhinnorhea. Mouth/Throat: Mucous membranes are moist.  Oropharynx non-erythematous. Neck: No  stridor.   Nontender with no meningismus Cardiovascular: Normal rate, regular rhythm. Grossly normal heart sounds.  Good peripheral circulation. Respiratory: Normal respiratory effort.  No retractions. Lungs CTAB. Abdominal: Soft and nontender. No distention. No guarding no rebound Back:  There is no focal tenderness or step off.  there is no midline tenderness there are no lesions noted. there is no CVA tenderness Musculoskeletal: No lower extremity tenderness, no upper extremity tenderness. No joint effusions, no DVT signs strong distal pulses no edema Neurologic:  Normal speech and language. No gross focal neurologic deficits are appreciated.  Skin:  Skin is warm, dry and  intact. No rash noted. Psychiatric: Mood and affect are normal. Speech and behavior are normal.  ____________________________________________   LABS (all labs ordered are listed, but only abnormal results are displayed)  Labs Reviewed  BASIC METABOLIC PANEL - Abnormal; Notable for the following:       Result Value   Glucose, Bld 111 (*)    All other components within normal limits  CBC - Abnormal; Notable for the following:    RDW 14.6 (*)    All other components within normal limits  URINALYSIS, COMPLETE (UACMP) WITH MICROSCOPIC - Abnormal; Notable for the following:    Color, Urine YELLOW (*)    APPearance CLEAR (*)    Hgb urine dipstick SMALL (*)    Squamous Epithelial / LPF 0-5 (*)    All other components within normal limits  TROPONIN I   ____________________________________________  EKG  I personally interpreted any EKGs ordered by me or triage Sinus rhythm at 65 bpm, ST elevation likely repolarization abnormality. Normal axis. No significant change from prior ____________________________________________  RADIOLOGY  I reviewed any imaging ordered by me or triage that were performed during my shift and, if possible, patient and/or family made aware of any abnormal findings. ____________________________________________   PROCEDURES  Procedure(s) performed: None  Procedures  Critical Care performed: None  ____________________________________________   INITIAL IMPRESSION / ASSESSMENT AND PLAN / ED COURSE  Pertinent labs & imaging results that were available during my care of the patient were reviewed by me and considered in my medical decision making (see chart for details).  Patient here with a sensation of lightheadedness after standing up from lying on his back underneath the car for several hours with essentially very limited by mouth intake during the course of the day. He has no symptoms at this time. I believe this is likely positional from orthostatic  issues related to poor intake today. Blood work is reassuring. Urine is reassuring. We will give him IV fluid and reassessed orthostatics and we will encourage him to eat.  ----------------------------------------- 5:35 PM on 01/02/2017 -----------------------------------------  And in no acute distress, continues with no symptoms, requesting discharge. Return precautions and follow-up given and understood. Did advise him that his blood pressure is slightly elevated here, unclear what his baseline is. Obviously return back when the start of patient's chief complaint is lightheadedness when he stands up on blood pressure medication however we did strongly advise that he follow closely with outpatient resources for ongoing care and recheck.   ____________________________________________   FINAL CLINICAL IMPRESSION(S) / ED DIAGNOSES  Final diagnoses:  None      This chart was dictated using voice recognition software.  Despite best efforts to proofread,  errors can occur which can change meaning.      Schuyler Amor, MD 01/02/17 [place], MD 01/02/17 [place]  Burlene Arnt, MD 01/02/17 [id number]

## 2017-01-02 NOTE — ED Notes (Signed)
Pt received 556ml NS bolus, cracker and water PO and reports feeling "fine" at this time when asked if feeling better/normal self

## 2017-01-02 NOTE — Discharge Instructions (Signed)
Drink plenty of fluids, if you have any chest pain shortness of breath headache weakness numbness or you feel worse in any way return to the emergency department. Do not fail to eat 3 meals a day and drink plenty of fluids.

## 2017-01-02 NOTE — ED Notes (Signed)
Pt to toilet in room without complications at this time.

## 2017-01-02 NOTE — ED Triage Notes (Signed)
Pt presents to ED via POV, pt states was working on a car and when he got up he got dizzy. Pt states he felt like he was going to pass out once or twice. Pt also states he thinks his BP is elevated.

## 2019-08-31 ENCOUNTER — Other Ambulatory Visit: Payer: Self-pay

## 2019-08-31 ENCOUNTER — Ambulatory Visit
Admission: EM | Admit: 2019-08-31 | Discharge: 2019-08-31 | Disposition: A | Discharge status: Home / Self Care | Payer: Medicare Other | Attending: Family Medicine | Admitting: Family Medicine

## 2019-08-31 ENCOUNTER — Encounter: Payer: Self-pay | Admitting: Emergency Medicine

## 2019-08-31 DIAGNOSIS — M79604 Pain in right leg: Secondary | ICD-10-CM | POA: Diagnosis not present

## 2019-08-31 DIAGNOSIS — I10 Essential (primary) hypertension: Secondary | ICD-10-CM | POA: Diagnosis present

## 2019-08-31 DIAGNOSIS — M79605 Pain in left leg: Secondary | ICD-10-CM | POA: Diagnosis not present

## 2019-08-31 LAB — CBC WITH DIFFERENTIAL/PLATELET
Abs Immature Granulocytes: 0 10*3/uL (ref 0.00–0.07)
Basophils Absolute: 0.1 10*3/uL (ref 0.0–0.1)
Basophils Relative: 1 %
Eosinophils Absolute: 0.2 10*3/uL (ref 0.0–0.5)
Eosinophils Relative: 5 %
HCT: 40.2 % (ref 39.0–52.0)
Hemoglobin: 13.5 g/dL (ref 13.0–17.0)
Immature Granulocytes: 0 %
Lymphocytes Relative: 48 %
Lymphs Abs: 1.8 10*3/uL (ref 0.7–4.0)
MCH: 31.4 pg (ref 26.0–34.0)
MCHC: 33.6 g/dL (ref 30.0–36.0)
MCV: 93.5 fL (ref 80.0–100.0)
Monocytes Absolute: 0.3 10*3/uL (ref 0.1–1.0)
Monocytes Relative: 7 %
Neutro Abs: 1.4 10*3/uL — ABNORMAL LOW (ref 1.7–7.7)
Neutrophils Relative %: 39 %
Platelets: 241 10*3/uL (ref 150–400)
RBC: 4.3 MIL/uL (ref 4.22–5.81)
RDW: 13.2 % (ref 11.5–15.5)
WBC: 3.7 10*3/uL — ABNORMAL LOW (ref 4.0–10.5)
nRBC: 0 % (ref 0.0–0.2)

## 2019-08-31 LAB — COMPREHENSIVE METABOLIC PANEL
ALT: 19 U/L (ref 0–44)
AST: 26 U/L (ref 15–41)
Albumin: 4.5 g/dL (ref 3.5–5.0)
Alkaline Phosphatase: 52 U/L (ref 38–126)
Anion gap: 6 (ref 5–15)
BUN: 23 mg/dL (ref 8–23)
CO2: 25 mmol/L (ref 22–32)
Calcium: 9.6 mg/dL (ref 8.9–10.3)
Chloride: 106 mmol/L (ref 98–111)
Creatinine, Ser: 1.02 mg/dL (ref 0.61–1.24)
GFR calc Af Amer: >60 mL/min (ref >60)
GFR calc non Af Amer: >60 mL/min (ref >60)
Glucose, Bld: 97 mg/dL (ref 70–99)
Potassium: 4 mmol/L (ref 3.5–5.1)
Sodium: 137 mmol/L (ref 135–145)
Total Bilirubin: 0.8 mg/dL (ref 0.3–1.2)
Total Protein: 7.5 g/dL (ref 6.5–8.1)

## 2019-08-31 MED ORDER — NAPROXEN 500 MG PO TABS: 500.0000 mg | ORAL_TABLET | Freq: Two times a day (BID) | ORAL | 0 refills | Status: DC | PRN

## 2019-08-31 MED ORDER — AMLODIPINE BESYLATE 5 MG PO TABS: 5.0000 mg | ORAL_TABLET | Freq: Every day | ORAL | 0 refills | Status: DC

## 2019-08-31 NOTE — ED Triage Notes (Addendum)
Patient here today c/o Bilateral leg pain x2wks and Headache x2wks   OTC: Tylenol  Denies:chest pain,tingling or numbness

## 2019-08-31 NOTE — Discharge Instructions (Addendum)
Medications as prescribed.  Labs reassuring.  Call [facility] [phone]) Or [place] [phone]) to get yourself a primary care doctor  Take care  Dr. Lacinda Axon

## 2019-08-31 NOTE — ED Provider Notes (Signed)
[facility]    CSN: [id number] Arrival date & time: 08/31/19  0827  History   Chief Complaint Headache, leg pain  HPI  68 year old male presents with the above complaints.  Patient reports a 2-week history of bilateral leg pain.  He localizes the pain to the calves and posterior thighs.  States that it is worse when he stands and then seems to improve as he ambulates and is active.  He has no pain while sitting.  He rates his pain as 5/10 in severity.  Denies back pain.  No reports of numbness or paresthesias.  He has taken Tylenol with limited improvement.  Additionally, patient reports a 2-week history of intermittent headache.  He states that the headache is located in the frontal region when it occurs.  Denies vision changes, nausea, vomiting.  No known inciting factor.  Patient does not have a primary care provider.  No other reported symptoms.  No other complaints.  History reviewed and updated as below.  Past Medical History:  Diagnosis Date  . Hypertension   Hx of SBO ? Crohn's disease  Home Medications    Prior to Admission medications   Medication Sig Start Date End Date Taking? Authorizing Provider  amLODipine (NORVASC) 5 MG tablet Take 1 tablet (5 mg total) by mouth daily. 08/31/19   Coral Spikes, DO  naproxen (NAPROSYN) 500 MG tablet Take 1 tablet (500 mg total) by mouth 2 (two) times daily as needed for moderate pain. 08/31/19   Coral Spikes, DO    Family History Family History  Problem Relation Age of Onset  . Heart disease Mother     Social History Social History   Tobacco Use  . Smoking status: Former Smoker    Quit date: 09/2015    Years since quitting: 3.9  . Smokeless tobacco: Never Used  Substance Use Topics  . Alcohol use: No  . Drug use: Never     Allergies   Patient has no known allergies.   Review of Systems Review of Systems  Musculoskeletal:       Leg pain.  Neurological: Positive for headaches.   Physical Exam  Triage Vital Signs ED Triage Vitals  Enc Vitals Group     BP 08/31/19 0840 (!) 176/100     Pulse Rate 08/31/19 0840 70     Resp 08/31/19 0840 18     Temp 08/31/19 0840 98.2 F (36.8 C)     Temp Source 08/31/19 0840 Oral     SpO2 08/31/19 0840 97 %     Weight 08/31/19 0837 165 lb (74.8 kg)     Height --      Head Circumference --      Peak Flow --      Pain Score 08/31/19 0836 5     Pain Loc --      Pain Edu? --      Excl. in [place]? --    No data found.  Updated Vital Signs BP (!) 176/100 (BP Location: Left Arm)   Pulse 70   Temp 98.2 F (36.8 C) (Oral)   Resp 18   Wt 74.8 kg   SpO2 97%   BMI 26.63 kg/m   Visual Acuity Right Eye Distance:   Left Eye Distance:   Bilateral Distance:    Right Eye Near:   Left Eye Near:    Bilateral Near:     Physical Exam Vitals signs and nursing note reviewed.  Constitutional:  General: He is not in acute distress.    Appearance: Normal appearance. He is not ill-appearing.  HENT:     Head: Normocephalic and atraumatic.  Eyes:     General:        Right eye: No discharge.        Left eye: No discharge.     Conjunctiva/sclera: Conjunctivae normal.  Cardiovascular:     Rate and Rhythm: Normal rate and regular rhythm.     Heart sounds: No murmur.  Pulmonary:     Effort: Pulmonary effort is normal.     Breath sounds: Normal breath sounds. No wheezing, rhonchi or rales.  Abdominal:     General: There is no distension.     Palpations: Abdomen is soft.     Tenderness: There is no abdominal tenderness.  Musculoskeletal:     Comments: Bilateral calves with no evidence of DVT.  Nontender to palpation.  No tenderness to palpation of the posterior thighs.  Skin:         Comments: Large lipoma noted at the labelled location.  Neurological:     Mental Status: He is alert.  Psychiatric:        Behavior: Behavior normal.     Comments: Flat affect.    UC Treatments / Results  Labs (all labs ordered are listed, but only  abnormal results are displayed) Labs Reviewed  CBC WITH DIFFERENTIAL/PLATELET - Abnormal; Notable for the following components:      Result Value   WBC 3.7 (*)    Neutro Abs 1.4 (*)    All other components within normal limits  COMPREHENSIVE METABOLIC PANEL    EKG   Radiology No results found.  Procedures Procedures (including critical care time)  Medications Ordered in UC Medications - No data to display  Initial Impression / Assessment and Plan / UC Course  I have reviewed the triage vital signs and the nursing notes.  Pertinent labs & imaging results that were available during my care of the patient were reviewed by me and considered in my medical decision making (see chart for details).    68 year old male presents with headache and leg pain.  Patient's headaches likely secondary to uncontrolled hypertension.  Blood pressure elevated today.  Laboratory studies obtained and are reassuring.  Placing on Norvasc.  His leg pain appears to be musculoskeletal in origin.  As needed naproxen.  Information given regarding local primary care providers.    Final Clinical Impressions(s) / UC Diagnoses   Final diagnoses:  Leg pain, bilateral  Essential hypertension     Discharge Instructions     Medications as prescribed.  Labs reassuring.  Call [facility] [phone]) Or [place] [phone]) to get yourself a primary care doctor  Take care  Dr. Lacinda Axon     ED Prescriptions    Medication Sig [place]. Provider   amLODipine (NORVASC) 5 MG tablet Take 1 tablet (5 mg total) by mouth daily. 90 tablet Tripp Goins G, DO   naproxen (NAPROSYN) 500 MG tablet Take 1 tablet (500 mg total) by mouth 2 (two) times daily as needed for moderate pain. 30 tablet Coral Spikes, DO     PDMP not reviewed this encounter.   Coral Spikes, [place] 08/31/19 [phone]

## 2019-10-07 ENCOUNTER — Other Ambulatory Visit: Payer: Self-pay

## 2019-10-07 ENCOUNTER — Encounter: Payer: Self-pay | Admitting: Emergency Medicine

## 2019-10-07 ENCOUNTER — Emergency Department
Admission: EM | Admit: 2019-10-07 | Discharge: 2019-10-07 | Disposition: A | Discharge status: Home / Self Care | Payer: Medicare Other | Attending: Student in an Organized Health Care Education/Training Program | Admitting: Student in an Organized Health Care Education/Training Program

## 2019-10-07 DIAGNOSIS — M62838 Other muscle spasm: Secondary | ICD-10-CM | POA: Diagnosis not present

## 2019-10-07 DIAGNOSIS — M79604 Pain in right leg: Secondary | ICD-10-CM | POA: Diagnosis present

## 2019-10-07 DIAGNOSIS — M79605 Pain in left leg: Secondary | ICD-10-CM | POA: Insufficient documentation

## 2019-10-07 DIAGNOSIS — Z79899 Other long term (current) drug therapy: Secondary | ICD-10-CM | POA: Insufficient documentation

## 2019-10-07 DIAGNOSIS — R300 Dysuria: Secondary | ICD-10-CM | POA: Insufficient documentation

## 2019-10-07 DIAGNOSIS — I10 Essential (primary) hypertension: Secondary | ICD-10-CM | POA: Diagnosis not present

## 2019-10-07 DIAGNOSIS — Z87891 Personal history of nicotine dependence: Secondary | ICD-10-CM | POA: Insufficient documentation

## 2019-10-07 LAB — URINALYSIS, COMPLETE (UACMP) WITH MICROSCOPIC
Bacteria, UA: NONE SEEN
Bilirubin Urine: NEGATIVE
Glucose, UA: NEGATIVE mg/dL
Hgb urine dipstick: NEGATIVE
Ketones, ur: NEGATIVE mg/dL
Leukocytes,Ua: NEGATIVE
Nitrite: NEGATIVE
Protein, ur: 30 mg/dL — AB
Specific Gravity, Urine: 1.023 (ref 1.005–1.030)
pH: 5 (ref 5.0–8.0)

## 2019-10-07 MED ORDER — METHOCARBAMOL 500 MG PO TABS: 500.0000 mg | ORAL_TABLET | Freq: Three times a day (TID) | ORAL | 0 refills | Status: DC | PRN

## 2019-10-07 NOTE — ED Notes (Signed)
Pt to the er for pain to the backs of his legs. Pt denies injury or swelling. Pt reports pain when standing and walking. Pt reports pain to the thighs and the calves but not a continuous running pain. Pt denies back issues. Pain started a month ago.

## 2019-10-07 NOTE — ED Triage Notes (Signed)
Pt via POV from home. Pt c/o bialteral leg pain that started about a month ago. Pt describes as a aching pain. Pt also c/o R flank pain that has been going on for years. Pt NAD noted, Pt A&Ox4, respiration are equal and unlabored. Pt has a hx of HTN and took his medication approx 20 mins ago.

## 2019-10-07 NOTE — ED Provider Notes (Signed)
[facility] Emergency Department Provider Note ____________________________________________   First MD Initiated Contact with Patient 10/07/19 1553     (approximate)  I have reviewed the triage vital signs and the nursing notes.   HISTORY  Chief Complaint Leg Pain  HPI Willie Rivas is a 68 y.o. male presents to the emergency department for treatment and evaluation of bilateral lower extremity pain that has been ongoing for the past couple of months and occasional dysuria.  He was evaluated in October for the leg pain and diagnosed with musculoskeletal pain and given Naprosyn.  Patient states that the Naprosyn does not seem to help.  Bilateral flank pain and dysuria has been intermittent for the past several years.  He has no concern of STI.  He is noted to be hypertensive, however he just took his blood pressure medicine about 20 to 30 minutes before he came to the hospital today.         Past Medical History:  Diagnosis Date  . Hypertension     Patient Active Problem List   Diagnosis Date Noted  . Acute appendicitis with peritoneal abscess   . Small bowel obstruction ([place])     History reviewed. No pertinent surgical history.  Prior to Admission medications   Medication Sig Start Date End Date Taking? Authorizing Provider  amLODipine (NORVASC) 5 MG tablet Take 1 tablet (5 mg total) by mouth daily. 08/31/19   Coral Spikes, DO  methocarbamol (ROBAXIN) 500 MG tablet Take 1 tablet (500 mg total) by mouth every 8 (eight) hours as needed for muscle spasms. 10/07/19   Shanara Schnieders B, FNP  naproxen (NAPROSYN) 500 MG tablet Take 1 tablet (500 mg total) by mouth 2 (two) times daily as needed for moderate pain. 08/31/19   Coral Spikes, DO    Allergies Patient has no known allergies.  Family History  Problem Relation Age of Onset  . Heart disease Mother     Social History Social History   Tobacco Use  . Smoking status: Former Smoker    Quit date:  09/2015    Years since quitting: 4.0  . Smokeless tobacco: Never Used  Substance Use Topics  . Alcohol use: No  . Drug use: Never    Review of Systems  Constitutional: No fever/chills Eyes: No visual changes. ENT: No sore throat. Cardiovascular: Denies chest pain. Respiratory: Denies shortness of breath. Gastrointestinal: No abdominal pain.  No nausea, no vomiting.  No diarrhea.  No constipation. Genitourinary: Positive for dysuria. Musculoskeletal: Positive for bilateral flank and lower extremity pain. Skin: Negative for rash. Neurological: Negative for headaches, focal weakness or numbness  ____________________________________________   PHYSICAL EXAM:  VITAL SIGNS: ED Triage Vitals  Enc Vitals Group     BP 10/07/19 1512 (!) 169/101     Pulse Rate 10/07/19 1512 82     Resp 10/07/19 1512 16     Temp 10/07/19 1512 98.9 F (37.2 C)     Temp Source 10/07/19 1512 Oral     SpO2 10/07/19 1512 97 %     Weight 10/07/19 1513 160 lb (72.6 kg)     Height 10/07/19 1513 5\' 6"  (1.676 m)     Head Circumference --      Peak Flow --      Pain Score 10/07/19 1513 0     Pain Loc --      Pain Edu? --      Excl. in [place]? --     Constitutional:  Alert and oriented. Well appearing and in no acute distress. Eyes: Conjunctivae are normal.  Head: Atraumatic. Nose: No congestion/rhinnorhea. Mouth/Throat: Mucous membranes are moist.  Oropharynx non-erythematous. Neck: No stridor.   Hematological/Lymphatic/Immunilogical: No cervical lymphadenopathy. Cardiovascular: Normal rate, regular rhythm. Grossly normal heart sounds.  Good peripheral circulation. Respiratory: Normal respiratory effort.  No retractions. Lungs CTAB. Gastrointestinal: Soft and nontender. No distention. No abdominal bruits. No CVA tenderness. Genitourinary:  Musculoskeletal: Visual and palpable muscle spasms in bilateral calves on exam.  No joint effusions.  Neurologic:  Normal speech and language. No gross focal  neurologic deficits are appreciated. No gait instability. Skin:  Skin is warm, dry and intact. No rash noted. Psychiatric: Mood and affect are normal. Speech and behavior are normal.  ____________________________________________   LABS (all labs ordered are listed, but only abnormal results are displayed)  Labs Reviewed  URINALYSIS, COMPLETE (UACMP) WITH MICROSCOPIC - Abnormal; Notable for the following components:      Result Value   Color, Urine YELLOW (*)    APPearance CLEAR (*)    Protein, ur 30 (*)    All other components within normal limits   ____________________________________________  EKG  Not indicated ____________________________________________  RADIOLOGY  ED MD interpretation:    Not indicated  Official radiology report(s): No results found.  ____________________________________________   PROCEDURES  Procedure(s) performed (including Critical Care):  Procedures  ____________________________________________   INITIAL IMPRESSION / ASSESSMENT AND PLAN     68 year old male presenting to the emergency department for treatment and evaluation of bilateral lower extremity pain that has been ongoing for the past couple months.  He is also experiencing some dysuria that has been intermittent for the past several years.  See HPI for further details.  Plan will be to collect urine specimen.  On exam, visual muscle spasms bilateral calves.  DIFFERENTIAL DIAGNOSIS  Muscle spasm, acute cystitis, STI, nephrolithiasis/ureterolithiasis  ED COURSE  Analysis is not concerning for any acute cystitis, pyelonephritis, no hemoglobin or red blood cells that would indicate nephrolithiasis or ureterolithiasis.  Patient will be treated for muscle spasms with methocarbamol.  He was advised to use it only when he is not working or driving.  He was advised to take Tylenol or ibuprofen when at work.  He is to follow-up with primary care when possible.  He is to return to the  emergency department for symptoms of change or worsen if unable to schedule an appointment. ____________________________________________   FINAL CLINICAL IMPRESSION(S) / ED DIAGNOSES  Final diagnoses:  Muscle spasm  Dysuria     ED Discharge Orders         Ordered    methocarbamol (ROBAXIN) 500 MG tablet  Every 8 hours PRN     10/07/19 1635           Note:  This document was prepared using Dragon voice recognition software and may include unintentional dictation errors.   Victorino Dike, FNP 10/07/19 1653    Merlyn Lot, MD 10/07/19 [phone]

## 2019-10-07 NOTE — ED Notes (Signed)
Pt verbalized understanding of discharge instructions. NAD at this time. 

## 2019-10-07 NOTE — Discharge Instructions (Signed)
Please call and schedule follow-up appointment with primary care.  Do not take the muscle relaxers if you are working or driving.  Return to the emergency department for symptoms of change or worsen if you are unable to schedule appointment.

## 2019-11-13 ENCOUNTER — Ambulatory Visit (INDEPENDENT_AMBULATORY_CARE_PROVIDER_SITE_OTHER): Payer: Medicare Other | Admitting: Adult Health

## 2019-11-13 ENCOUNTER — Encounter: Payer: Self-pay | Admitting: Adult Health

## 2019-11-13 ENCOUNTER — Other Ambulatory Visit (HOSPITAL_COMMUNITY)
Admission: RE | Admit: 2019-11-13 | Discharge: 2019-11-13 | Disposition: A | Discharge status: Home / Self Care | Payer: Medicare Other | Source: Ambulatory Visit | Attending: Adult Health | Admitting: Adult Health

## 2019-11-13 ENCOUNTER — Other Ambulatory Visit: Payer: Self-pay

## 2019-11-13 VITALS — BP 142/84 | HR 68 | Temp 96.0°F | Resp 16 | Ht 66.0 in | Wt 164.6 lb

## 2019-11-13 DIAGNOSIS — Z1211 Encounter for screening for malignant neoplasm of colon: Secondary | ICD-10-CM

## 2019-11-13 DIAGNOSIS — Z131 Encounter for screening for diabetes mellitus: Secondary | ICD-10-CM

## 2019-11-13 DIAGNOSIS — R300 Dysuria: Secondary | ICD-10-CM

## 2019-11-13 DIAGNOSIS — M79604 Pain in right leg: Secondary | ICD-10-CM

## 2019-11-13 DIAGNOSIS — M79605 Pain in left leg: Secondary | ICD-10-CM

## 2019-11-13 DIAGNOSIS — R350 Frequency of micturition: Secondary | ICD-10-CM | POA: Diagnosis not present

## 2019-11-13 DIAGNOSIS — R351 Nocturia: Secondary | ICD-10-CM

## 2019-11-13 DIAGNOSIS — I10 Essential (primary) hypertension: Secondary | ICD-10-CM | POA: Diagnosis not present

## 2019-11-13 DIAGNOSIS — Z7251 High risk heterosexual behavior: Secondary | ICD-10-CM

## 2019-11-13 DIAGNOSIS — M791 Myalgia, unspecified site: Secondary | ICD-10-CM | POA: Diagnosis not present

## 2019-11-13 LAB — POCT URINALYSIS DIPSTICK
Bilirubin, UA: NEGATIVE
Blood, UA: NEGATIVE
Glucose, UA: NEGATIVE
Ketones, UA: NEGATIVE
Leukocytes, UA: NEGATIVE
Nitrite, UA: NEGATIVE
Protein, UA: NEGATIVE
Spec Grav, UA: 1.015 (ref 1.010–1.025)
Urobilinogen, UA: 0.2 E.U./dL
pH, UA: 5 (ref 5.0–8.0)

## 2019-11-13 MED ORDER — SULFAMETHOXAZOLE-TRIMETHOPRIM 800-160 MG PO TABS: 1.0000 | ORAL_TABLET | Freq: Two times a day (BID) | ORAL | 0 refills | Status: DC

## 2019-11-13 MED ORDER — AMLODIPINE BESYLATE 5 MG PO TABS: 5.0000 mg | ORAL_TABLET | Freq: Every day | ORAL | 0 refills | Status: AC

## 2019-11-13 MED ORDER — PREDNISONE 10 MG (21) PO TBPK: ORAL_TABLET | ORAL | 0 refills | Status: DC

## 2019-11-13 NOTE — Progress Notes (Addendum)
Ordered PSA. DRE normal. Will culture to be sure and await PSA. Patient in agreement for testing for urine STD's. Declined Hepatitis C, HIV and RPR.

## 2019-11-13 NOTE — Progress Notes (Signed)
Patient: Willie Rivas, Male    DOB: Mar 03, 1951, 69 y.o.   MRN: [id number] Visit Date: 11/13/2019  Today's Provider: Marcille Buffy, FNP   Chief Complaint  Patient presents with  . New Patient (Initial Visit)   Subjective:    New Patient Willie Rivas is a 69 y.o. male who presents today for health maintenance and establish care. He feels fairly well, patient would like to address concerns of muscle spasms in his lower legs that he states has been present for the past 2 months. Patient reports pain when standing or walking, patient reports that he was previously evaluated for this problem in the past but states that medication did not help, in past ED note patient had been on Methocarbamol and Naproxen. Patient would also like to address symptom of dysuria for the past month with every urination, patient states that he has been having frequency of urination in the PM. He reports exercising. He reports he is sleeping fairly well.  Onset x 2 months. He reports occasional muscle spasms in upper thighs bilateral, Denies any paresthesia, or radiculopathy. Denies any back pain. Denies any injury. No longer taking Robaxin or Naproxen he was given in the ER  10/07/2019. Worse with standing. Bilateral leg pain mostly in thighs does go to calves at times. Tylenol does not help. Pain with standing/ walking 7/10. Sitting reports " not reallu feel anything maybe a 1/10 on pain scale".  Denies any significant lower back pain, denies saddle paresthesia or loss of bowel or bladder control.   Also of note had back pain 5/10 seen at urgent care on  08/31/2019 pain to the calves and posterior thighs that was worse with standing, without paresthesias or numbness. Tylenol gave mild improvement. Treated for musculoskeletal pain with naproxen. Patient denies any improvement.   Amlodipine 5mg  tablet he takes daily.  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  Denies any headache,  edema.   He reports he has dysuria with urination since end of December. Denies any fever, chills. Denies hematuria. Reports getting up twice at night. Reports normal stream of urine. Denies hesitancy. Some urgency in the past few days.  Denies any penile sores, discharge or lesions.  He is sexually active does not divulge when questioned about exposure but would like urine testing only. Declined HIV, Hepatitis, and RPR.   Patient  denies any fever, chills, rash, chest pain, shortness of breath, nausea, vomiting, or diarrhea.   -----------------------------------------------------------------   Review of Systems  Constitutional: Negative.   HENT: Negative.   Eyes: Negative.   Respiratory: Negative.   Cardiovascular: Negative.   Gastrointestinal: Negative.   Endocrine: Negative.   Genitourinary: Positive for difficulty urinating, dysuria and urgency. Negative for decreased urine volume, discharge, enuresis, flank pain, frequency, genital sores, hematuria, penile pain, penile swelling, scrotal swelling and testicular pain.  Musculoskeletal: Positive for arthralgias and myalgias. Negative for back pain, gait problem, joint swelling, neck pain and neck stiffness.  Skin: Negative for color change, pallor, rash and wound.       Lipoma on right upper back onset  presence unknown found in ER,   Psychiatric/Behavioral: Negative.   All other systems reviewed and are negative.   Social History He  reports that he quit smoking about 4 years ago. He has never used smokeless tobacco. He reports that he does not drink alcohol or use drugs. Social History   Socioeconomic History  . Marital status: Single  Spouse name: Not on file  . Number of children: Not on file  . Years of education: Not on file  . Highest education level: Not on file  Occupational History  . Not on file  Tobacco Use  . Smoking status: Former Smoker    Quit date: 09/2015    Years since quitting: 4.2  . Smokeless  tobacco: Never Used  Substance and Sexual Activity  . Alcohol use: No  . Drug use: Never  . Sexual activity: Not on file  Other Topics Concern  . Not on file  Social History Narrative  . Not on file   Social Determinants of Health   Financial Resource Strain:   . Difficulty of Paying Living Expenses: Not on file  Food Insecurity:   . Worried About Charity fundraiser in the Last Year: Not on file  . Ran Out of Food in the Last Year: Not on file  Transportation Needs:   . Lack of Transportation (Medical): Not on file  . Lack of Transportation (Non-Medical): Not on file  Physical Activity:   . Days of Exercise per Week: Not on file  . Minutes of Exercise per Session: Not on file  Stress:   . Feeling of Stress : Not on file  Social Connections:   . Frequency of Communication with Friends and Family: Not on file  . Frequency of Social Gatherings with Friends and Family: Not on file  . Attends Religious Services: Not on file  . Active Member of Clubs or Organizations: Not on file  . Attends Archivist Meetings: Not on file  . Marital Status: Not on file    Patient Active Problem List   Diagnosis Date Noted  . Acute appendicitis with peritoneal abscess   . Small bowel obstruction [facility])     Past Surgical History:  Procedure Laterality Date  . NO PAST SURGERIES      Family History  Family Status  Relation Name Status  . Mother  (Not Specified)   His family history includes Heart disease in his mother.     No Known Allergies  Previous Medications   AMLODIPINE (NORVASC) 5 MG TABLET    Take 1 tablet (5 mg total) by mouth daily.   METHOCARBAMOL (ROBAXIN) 500 MG TABLET    Take 1 tablet (500 mg total) by mouth every 8 (eight) hours as needed for muscle spasms.   NAPROXEN (NAPROSYN) 500 MG TABLET    Take 1 tablet (500 mg total) by mouth 2 (two) times daily as needed for moderate pain.    Patient Care Team: Doreen Beam, FNP as PCP - General (Family  Medicine)      Objective:   Vitals: BP (!) 142/84   Pulse 68   Temp (!) 96 F (35.6 C) (Oral)   Resp 16   Ht 5\' 6"  (1.676 m)   Wt 164 lb 9.6 oz (74.7 kg)   SpO2 98%   BMI 26.57 kg/m    Physical Exam Vitals reviewed. Exam conducted with a chaperone present.  Constitutional:      General: He is not in acute distress.    Appearance: Normal appearance. He is not ill-appearing, toxic-appearing or diaphoretic.     Comments: Patient moves on and off of exam table and in room without difficulty. Gait is normal in hall and in room. Patient is oriented to person place time and situation. Patient answers questions appropriately and engages in conversation.   HENT:  Head: Normocephalic and atraumatic.     Right Ear: Tympanic membrane, ear canal and external ear normal. There is no impacted cerumen.     Left Ear: Tympanic membrane, ear canal and external ear normal. There is no impacted cerumen.     Nose: Nose normal. No congestion or rhinorrhea.     Mouth/Throat:     Mouth: Mucous membranes are moist.     Pharynx: No oropharyngeal exudate.  Eyes:     General: No scleral icterus.       Right eye: No discharge.        Left eye: No discharge.     Extraocular Movements: Extraocular movements intact.     Conjunctiva/sclera: Conjunctivae normal.     Pupils: Pupils are equal, round, and reactive to light.  Cardiovascular:     Rate and Rhythm: Normal rate and regular rhythm.     Pulses: Normal pulses.     Heart sounds: Normal heart sounds. No murmur. No friction rub. No gallop.   Pulmonary:     Effort: Pulmonary effort is normal. No respiratory distress.     Breath sounds: Normal breath sounds. No stridor. No wheezing, rhonchi or rales.  Chest:     Chest wall: No tenderness.  Abdominal:     Palpations: Abdomen is soft.     Tenderness: There is no right CVA tenderness or left CVA tenderness.  Genitourinary:    Penis: Normal.      Testes: Normal. Cremasteric reflex is present.      Epididymis:     Right: Normal.     Left: Normal.     Prostate: Normal. Not enlarged, not tender and no nodules present.     Rectum: No tenderness, external hemorrhoid or internal hemorrhoid. Normal anal tone.     Comments: No stool for guaiac  Musculoskeletal:     Right shoulder: Normal range of motion.     Left shoulder: Normal range of motion.     Cervical back: Normal range of motion and neck supple.     Right lower leg: Normal.     Left lower leg: Normal.     Right ankle: Normal.     Left ankle: Normal.     Right foot: Normal.     Left foot: Normal.  Lymphadenopathy:     Cervical: No cervical adenopathy.  Skin:    General: Skin is warm.     Capillary Refill: Capillary refill takes less than 2 seconds.  Neurological:     Mental Status: He is alert and oriented to person, place, and time.     Cranial Nerves: No cranial nerve deficit.     Sensory: No sensory deficit.     Motor: No weakness.     Coordination: Coordination normal.     Gait: Gait normal.     Deep Tendon Reflexes: Reflexes normal.     Reflex Scores:      Tricep reflexes are 2+ on the right side and 2+ on the left side.      Brachioradialis reflexes are 2+ on the right side and 2+ on the left side.      Achilles reflexes are 2+ on the right side and 2+ on the left side. Psychiatric:        Attention and Perception: Attention normal.        Mood and Affect: Mood normal.        Speech: Speech normal.        Behavior: Behavior normal. Behavior is cooperative.  Thought Content: Thought content normal.        Cognition and Memory: Cognition normal.        Judgment: Judgment normal.      Depression Screen PHQ 2/9 Scores 11/13/2019  PHQ - 2 Score 0  PHQ- 9 Score 0      Assessment & Plan:     Routine Health Maintenance and Physical Exam  Exercise Activities and Dietary recommendations Goals   None      There is no immunization history on file for this patient.  Health Maintenance  Topic  Date Due  . Hepatitis C Screening  10-08-1951  . TETANUS/TDAP  10/23/1970  . COLONOSCOPY  10/23/2001  . PNA vac Low Risk Adult (1 of 2 - PCV13) 10/23/2016  . INFLUENZA VACCINE  06/02/2019   1. Dysuria Urine dipstick no bacteria. Sexually active will screen STD's. Will treat with antibiotic to see if any improvement.  - POCT urinalysis dipstick - CBC with Differential/Platelet 005009 - Comprehensive Metabolic Panel (CMET); Future - PSA - Urine culture - Urine cytology ancillary only  2. Essential hypertension Amlodipine refilled.  - Comprehensive Metabolic Panel (CMET); Future - Lipid Panel w/o Chol/HDL Ratio  3. Urinary frequency  - PSA  4. Muscle pain Will check labs.  - CBC with Differential/Platelet [id number] - Comprehensive Metabolic Panel (CMET); Future - TSH  5. Pain in both lower extremities Naproxen and Robaxin failed treatment. Will give Prednisone dose pack and follow up in 2- 3 weeks if no improvement.  - CBC with Differential/Platelet [id number] - Comprehensive Metabolic Panel (CMET); Future - TSH - DG Lumbar Spine Complete; Future  6. Screening for malignant neoplasm of colon  - Ambulatory referral to Gastroenterology  7. Nocturia PSA   8. Screening for diabetes mellitus Glucose   9. High risk heterosexual behavior STD testing  Orders Placed This Encounter  Procedures  . Urine culture  . DG Lumbar Spine Complete    Standing Status:   Future    Standing Expiration Date:   12/14/2019    Order Specific Question:   Reason for Exam (SYMPTOM  OR DIAGNOSIS REQUIRED)    Answer:   bilateral leg pain x 2 months / rule out lumbar etiology    Order Specific Question:   Preferred imaging location?    Answer:   [facility]    Order Specific Question:   Radiology Contrast Protocol - do NOT remove file path    Answer:   [email or web address]  . CBC with Differential/Platelet 005009  . Comprehensive Metabolic Panel  (CMET)    Standing Status:   Future    Standing Expiration Date:   12/14/2019  . TSH  . PSA  . Lipid Panel w/o Chol/HDL Ratio  . Ambulatory referral to Gastroenterology    Referral Priority:   Routine    Referral Type:   Consultation    Referral Reason:   Specialty Services Required    Number of Visits Requested:   1  . POCT urinalysis dipstick   Meds ordered this encounter  Medications  . predniSONE (STERAPRED UNI-PAK 21 TAB) 10 MG (21) TBPK tablet    Sig: PO: Take 6 tablets on day 1:Take 5 tablets day 2:Take 4 tablets day 3: Take 3 tablets day 4:Take 2 tablets day five: 5 Take 1 tablet day 6    Dispense:  21 tablet    Refill:  0  . amLODipine (NORVASC) 5 MG tablet    Sig: Take 1 tablet (5 mg total) by mouth daily.  Dispense:  90 tablet    Refill:  0  . sulfamethoxazole-trimethoprim (BACTRIM DS) 800-160 MG tablet    Sig: Take 1 tablet by mouth 2 (two) times daily.    Dispense:  10 tablet    Refill:  0   Declined HIV, hepatitis  C testing at this time.  Discussed health benefits of physical activity, and encouraged him to engage in regular exercise appropriate for his age and condition.   Return in about 1 month (around 12/14/2019), or if symptoms worsen or fail to improve, for at any time for any worsening symptoms, Go to Emergency room/ urgent care if worse. --------------------------- Advised patient call the office or your primary care doctor for an appointment if no improvement within 72 hours or if any symptoms change or worsen at any time  Advised ER or urgent Care if after hours or on weekend. Call 911 for emergency symptoms at any time.Patinet verbalized understanding of all instructions given/reviewed and treatment plan and has no further questions or concerns at this time.   -----------------------------------------

## 2019-11-13 NOTE — Patient Instructions (Signed)
Hypertension, Adult Hypertension is another name for high blood pressure. High blood pressure forces your heart to work harder to pump blood. This can cause problems over time. There are two numbers in a blood pressure reading. There is a top number (systolic) over a bottom number (diastolic). It is best to have a blood pressure that is below 120/80. Healthy choices can help lower your blood pressure, or you may need medicine to help lower it. What are the causes? The cause of this condition is not known. Some conditions may be related to high blood pressure. What increases the risk?  Smoking.  Having type 2 diabetes mellitus, high cholesterol, or both.  Not getting enough exercise or physical activity.  Being overweight.  Having too much fat, sugar, calories, or salt (sodium) in your diet.  Drinking too much alcohol.  Having long-term (chronic) kidney disease.  Having a family history of high blood pressure.  Age. Risk increases with age.  Race. You may be at higher risk if you are African American.  Gender. Men are at higher risk than women before age 45. After age 65, women are at higher risk than men.  Having obstructive sleep apnea.  Stress. What are the signs or symptoms?  High blood pressure may not cause symptoms. Very high blood pressure (hypertensive crisis) may cause: ? Headache. ? Feelings of worry or nervousness (anxiety). ? Shortness of breath. ? Nosebleed. ? A feeling of being sick to your stomach (nausea). ? Throwing up (vomiting). ? Changes in how you see. ? Very bad chest pain. ? Seizures. How is this treated?  This condition is treated by making healthy lifestyle changes, such as: ? Eating healthy foods. ? Exercising more. ? Drinking less alcohol.  Your health care provider may prescribe medicine if lifestyle changes are not enough to get your blood pressure under control, and if: ? Your top number is above 130. ? Your bottom number is above  80.  Your personal target blood pressure may vary. Follow these instructions at home: Eating and drinking   If told, follow the DASH eating plan. To follow this plan: ? Fill one half of your plate at each meal with fruits and vegetables. ? Fill one fourth of your plate at each meal with whole grains. Whole grains include whole-wheat pasta, brown rice, and whole-grain bread. ? Eat or drink low-fat dairy products, such as skim milk or low-fat yogurt. ? Fill one fourth of your plate at each meal with low-fat (lean) proteins. Low-fat proteins include fish, chicken without skin, eggs, beans, and tofu. ? Avoid fatty meat, cured and processed meat, or chicken with skin. ? Avoid pre-made or processed food.  Eat less than 1,500 mg of salt each day.  Do not drink alcohol if: ? Your doctor tells you not to drink. ? You are pregnant, may be pregnant, or are planning to become pregnant.  If you drink alcohol: ? Limit how much you use to:  0-1 drink a day for women.  0-2 drinks a day for men. ? Be aware of how much alcohol is in your drink. In the [place] one drink equals one 12 oz bottle of beer (355 mL), one 5 oz glass of wine (148 mL), or one 1 oz glass of hard liquor (44 mL). Lifestyle   Work with your doctor to stay at a healthy weight or to lose weight. Ask your doctor what the best weight is for you.  Get at least 30 minutes of exercise most   days of the week. This may include walking, swimming, or biking.  Get at least 30 minutes of exercise that strengthens your muscles (resistance exercise) at least 3 days a week. This may include lifting weights or doing Pilates.  Do not use any products that contain nicotine or tobacco, such as cigarettes, e-cigarettes, and chewing tobacco. If you need help quitting, ask your doctor.  Check your blood pressure at home as told by your doctor.  Keep all follow-up visits as told by your doctor. This is important. Medicines  Take over-the-counter  and prescription medicines only as told by your doctor. Follow directions carefully.  Do not skip doses of blood pressure medicine. The medicine does not work as well if you skip doses. Skipping doses also puts you at risk for problems.  Ask your doctor about side effects or reactions to medicines that you should watch for. Contact a doctor if you:  Think you are having a reaction to the medicine you are taking.  Have headaches that keep coming back (recurring).  Feel dizzy.  Have swelling in your ankles.  Have trouble with your vision. Get help right away if you:  Get a very bad headache.  Start to feel mixed up (confused).  Feel weak or numb.  Feel faint.  Have very bad pain in your: ? Chest. ? Belly (abdomen).  Throw up more than once.  Have trouble breathing. Summary  Hypertension is another name for high blood pressure.  High blood pressure forces your heart to work harder to pump blood.  For most people, a normal blood pressure is less than 120/80.  Making healthy choices can help lower blood pressure. If your blood pressure does not get lower with healthy choices, you may need to take medicine. This information is not intended to replace advice given to you by your health care provider. Make sure you discuss any questions you have with your health care provider. Document Revised: 06/28/2018 Document Reviewed: 06/28/2018 Elsevier Patient Education  [place]. Dysuria Dysuria is pain or discomfort while urinating. The pain or discomfort may be felt in the part of your body that drains urine from the bladder (urethra) or in the surrounding tissue of the genitals. The pain may also be felt in the groin area, lower abdomen, or lower back. You may have to urinate frequently or have the sudden feeling that you have to urinate (urgency). Dysuria can affect both men and women, but it is more common in women. Dysuria can be caused by many different things,  including:  Urinary tract infection.  Kidney stones or bladder stones.  Certain sexually transmitted infections (STIs), such as chlamydia.  Dehydration.  Inflammation of the tissues of the vagina.  Use of certain medicines.  Use of certain soaps or scented products that cause irritation. Follow these instructions at home: General instructions  Watch your condition for any changes.  Urinate often. Avoid holding urine for long periods of time.  After a bowel movement or urination, women should cleanse from front to back, using each tissue only once.  Urinate after sexual intercourse.  Keep all follow-up visits as told by your health care provider. This is important.  If you had any tests done to find the cause of dysuria, it is up to you to get your test results. Ask your health care provider, or the department that is doing the test, when your results will be ready. Eating and drinking   Drink enough fluid to keep your urine  pale yellow.  Avoid caffeine, tea, and alcohol. They can irritate the bladder and make dysuria worse. In men, alcohol may irritate the prostate. Medicines  Take over-the-counter and prescription medicines only as told by your health care provider.  If you were prescribed an antibiotic medicine, take it as told by your health care provider. Do not stop taking the antibiotic even if you start to feel better. Contact a health care provider if:  You have a fever.  You develop pain in your back or sides.  You have nausea or vomiting.  You have blood in your urine.  You are not urinating as often as you usually do. Get help right away if:  Your pain is severe and not relieved with medicines.  You cannot eat or drink without vomiting.  You are confused.  You have a rapid heartbeat while at rest.  You have shaking or chills.  You feel extremely weak. Summary  Dysuria is pain or discomfort while urinating. Many different conditions can lead  to dysuria.  If you have dysuria, you may have to urinate frequently or have the sudden feeling that you have to urinate (urgency).  Watch your condition for any changes. Keep all follow-up visits as told by your health care provider.  Make sure that you urinate often and drink enough fluid to keep your urine pale yellow. This information is not intended to replace advice given to you by your health care provider. Make sure you discuss any questions you have with your health care provider. Document Revised: 09/30/2017 Document Reviewed: 08/04/2017 Elsevier Patient Education  [place] Maintenance, Male Adopting a healthy lifestyle and getting preventive care are important in promoting health and wellness. Ask your health care provider about:  The right schedule for you to have regular tests and exams.  Things you can do on your own to prevent diseases and keep yourself healthy. What should I know about diet, weight, and exercise? Eat a healthy diet   Eat a diet that includes plenty of vegetables, fruits, low-fat dairy products, and lean protein.  Do not eat a lot of foods that are high in solid fats, added sugars, or sodium. Maintain a healthy weight Body mass index (BMI) is a measurement that can be used to identify possible weight problems. It estimates body fat based on height and weight. Your health care provider can help determine your BMI and help you achieve or maintain a healthy weight. Get regular exercise Get regular exercise. This is one of the most important things you can do for your health. Most adults should:  Exercise for at least 150 minutes each week. The exercise should increase your heart rate and make you sweat (moderate-intensity exercise).  Do strengthening exercises at least twice a week. This is in addition to the moderate-intensity exercise.  Spend less time sitting. Even light physical activity can be beneficial. Watch cholesterol and blood  lipids Have your blood tested for lipids and cholesterol at 69 years of age, then have this test every 5 years. You may need to have your cholesterol levels checked more often if:  Your lipid or cholesterol levels are high.  You are older than 69 years of age.  You are at high risk for heart disease. What should I know about cancer screening? Many types of cancers can be detected early and may often be prevented. Depending on your health history and family history, you may need to have cancer screening at various ages. This may  include screening for:  Colorectal cancer.  Prostate cancer.  Skin cancer.  Lung cancer. What should I know about heart disease, diabetes, and high blood pressure? Blood pressure and heart disease  High blood pressure causes heart disease and increases the risk of stroke. This is more likely to develop in people who have high blood pressure readings, are of African descent, or are overweight.  Talk with your health care provider about your target blood pressure readings.  Have your blood pressure checked: ? Every 3-5 years if you are 19-33 years of age. ? Every year if you are 45 years old or older.  If you are between the ages of 4 and 62 and are a current or former smoker, ask your health care provider if you should have a one-time screening for abdominal aortic aneurysm (AAA). Diabetes Have regular diabetes screenings. This checks your fasting blood sugar level. Have the screening done:  Once every three years after age 65 if you are at a normal weight and have a low risk for diabetes.  More often and at a younger age if you are overweight or have a high risk for diabetes. What should I know about preventing infection? Hepatitis B If you have a higher risk for hepatitis B, you should be screened for this virus. Talk with your health care provider to find out if you are at risk for hepatitis B infection. Hepatitis C Blood testing is recommended  for:  Everyone born from [birth year] through 1965.  Anyone with known risk factors for hepatitis C. Sexually transmitted infections (STIs)  You should be screened each year for STIs, including gonorrhea and chlamydia, if: ? You are sexually active and are younger than 68 years of age. ? You are older than 69 years of age and your health care provider tells you that you are at risk for this type of infection. ? Your sexual activity has changed since you were last screened, and you are at increased risk for chlamydia or gonorrhea. Ask your health care provider if you are at risk.  Ask your health care provider about whether you are at high risk for HIV. Your health care provider may recommend a prescription medicine to help prevent HIV infection. If you choose to take medicine to prevent HIV, you should first get tested for HIV. You should then be tested every 3 months for as long as you are taking the medicine. Follow these instructions at home: Lifestyle  Do not use any products that contain nicotine or tobacco, such as cigarettes, e-cigarettes, and chewing tobacco. If you need help quitting, ask your health care provider.  Do not use street drugs.  Do not share needles.  Ask your health care provider for help if you need support or information about quitting drugs. Alcohol use  Do not drink alcohol if your health care provider tells you not to drink.  If you drink alcohol: ? Limit how much you have to 0-2 drinks a day. ? Be aware of how much alcohol is in your drink. In the [place] one drink equals one 12 oz bottle of beer (355 mL), one 5 oz glass of wine (148 mL), or one 1 oz glass of hard liquor (44 mL). General instructions  Schedule regular health, dental, and eye exams.  Stay current with your vaccines.  Tell your health care provider if: ? You often feel depressed. ? You have ever been abused or do not feel safe at home. Summary  Adopting a healthy  lifestyle and getting  preventive care are important in promoting health and wellness.  Follow your health care provider's instructions about healthy diet, exercising, and getting tested or screened for diseases.  Follow your health care provider's instructions on monitoring your cholesterol and blood pressure. This information is not intended to replace advice given to you by your health care provider. Make sure you discuss any questions you have with your health care provider. Document Revised: 10/11/2018 Document Reviewed: 10/11/2018 Elsevier Patient Education  2020 [employer].

## 2019-11-14 LAB — CBC WITH DIFFERENTIAL/PLATELET
Basophils Absolute: 0.1 10*3/uL (ref 0.0–0.2)
Basos: 1 %
EOS (ABSOLUTE): 0.2 10*3/uL (ref 0.0–0.4)
Eos: 4 %
Hematocrit: 39.2 % (ref 37.5–51.0)
Hemoglobin: 13.1 g/dL (ref 13.0–17.7)
Immature Grans (Abs): 0 10*3/uL (ref 0.0–0.1)
Immature Granulocytes: 0 %
Lymphocytes Absolute: 1.3 10*3/uL (ref 0.7–3.1)
Lymphs: 26 %
MCH: 31.1 pg (ref 26.6–33.0)
MCHC: 33.4 g/dL (ref 31.5–35.7)
MCV: 93 fL (ref 79–97)
Monocytes Absolute: 0.4 10*3/uL (ref 0.1–0.9)
Monocytes: 7 %
Neutrophils Absolute: 3 10*3/uL (ref 1.4–7.0)
Neutrophils: 62 %
Platelets: 309 10*3/uL (ref 150–450)
RBC: 4.21 x10E6/uL (ref 4.14–5.80)
RDW: 13.2 % (ref 11.6–15.4)
WBC: 4.9 10*3/uL (ref 3.4–10.8)

## 2019-11-14 LAB — LIPID PANEL W/O CHOL/HDL RATIO
Cholesterol, Total: 226 mg/dL — ABNORMAL HIGH (ref 100–199)
HDL: 61 mg/dL (ref >39)
LDL Chol Calc (NIH): 146 mg/dL — ABNORMAL HIGH (ref 0–99)
Triglycerides: 105 mg/dL (ref 0–149)
VLDL Cholesterol Cal: 19 mg/dL (ref 5–40)

## 2019-11-14 LAB — URINE CYTOLOGY ANCILLARY ONLY
Chlamydia: NEGATIVE
Comment: NEGATIVE
Comment: NORMAL
Neisseria Gonorrhea: NEGATIVE

## 2019-11-14 LAB — TSH: TSH: 1.2 u[IU]/mL (ref 0.450–4.500)

## 2019-11-14 LAB — PSA: Prostate Specific Ag, Serum: 5.7 ng/mL — ABNORMAL HIGH (ref 0.0–4.0)

## 2019-11-15 ENCOUNTER — Ambulatory Visit
Admission: RE | Admit: 2019-11-15 | Discharge: 2019-11-15 | Disposition: A | Discharge status: Home / Self Care | Payer: Medicare Other | Source: Ambulatory Visit | Attending: Adult Health | Admitting: Adult Health

## 2019-11-15 ENCOUNTER — Other Ambulatory Visit: Payer: Self-pay

## 2019-11-15 DIAGNOSIS — M79605 Pain in left leg: Secondary | ICD-10-CM | POA: Insufficient documentation

## 2019-11-15 DIAGNOSIS — M79604 Pain in right leg: Secondary | ICD-10-CM | POA: Diagnosis not present

## 2019-11-15 LAB — URINE CULTURE: Organism ID, Bacteria: NO GROWTH

## 2019-11-16 NOTE — Progress Notes (Signed)
thyroid normal. PSA increased mildly, however rectal exam was performed prior as he did labs same day. Will repeat at next visit.  No signs of anemia or infection.  Total cholesterol and LDL increased.  urine culture normal. GC/ trich negative.  Lumbar x ray has some mild disc height loss and mild lumbar degenerative changes, unlikely to be causing leg pain. He can follow up with [facility] Monday to Friday 11am to [place] walk in hours if persist. Follow up with this office if any worsening symptoms or changes.

## 2019-11-19 ENCOUNTER — Encounter: Payer: Self-pay | Admitting: *Deleted

## 2019-11-30 ENCOUNTER — Emergency Department: Payer: Medicare Other

## 2019-11-30 ENCOUNTER — Emergency Department
Admission: EM | Admit: 2019-11-30 | Discharge: 2019-11-30 | Disposition: A | Discharge status: Home / Self Care | Payer: Medicare Other | Attending: Emergency Medicine | Admitting: Emergency Medicine

## 2019-11-30 ENCOUNTER — Other Ambulatory Visit: Payer: Self-pay

## 2019-11-30 ENCOUNTER — Encounter: Payer: Self-pay | Admitting: Emergency Medicine

## 2019-11-30 DIAGNOSIS — M79604 Pain in right leg: Secondary | ICD-10-CM | POA: Diagnosis present

## 2019-11-30 DIAGNOSIS — Z79899 Other long term (current) drug therapy: Secondary | ICD-10-CM | POA: Diagnosis not present

## 2019-11-30 DIAGNOSIS — M79605 Pain in left leg: Secondary | ICD-10-CM | POA: Insufficient documentation

## 2019-11-30 DIAGNOSIS — R42 Dizziness and giddiness: Secondary | ICD-10-CM | POA: Insufficient documentation

## 2019-11-30 DIAGNOSIS — R300 Dysuria: Secondary | ICD-10-CM | POA: Insufficient documentation

## 2019-11-30 DIAGNOSIS — I739 Peripheral vascular disease, unspecified: Secondary | ICD-10-CM

## 2019-11-30 DIAGNOSIS — I10 Essential (primary) hypertension: Secondary | ICD-10-CM | POA: Insufficient documentation

## 2019-11-30 DIAGNOSIS — K649 Unspecified hemorrhoids: Secondary | ICD-10-CM

## 2019-11-30 LAB — BASIC METABOLIC PANEL
Anion gap: 10 (ref 5–15)
BUN: 20 mg/dL (ref 8–23)
CO2: 25 mmol/L (ref 22–32)
Calcium: 9.4 mg/dL (ref 8.9–10.3)
Chloride: 102 mmol/L (ref 98–111)
Creatinine, Ser: 0.99 mg/dL (ref 0.61–1.24)
GFR calc Af Amer: >60 mL/min (ref >60)
GFR calc non Af Amer: >60 mL/min (ref >60)
Glucose, Bld: 130 mg/dL — ABNORMAL HIGH (ref 70–99)
Potassium: 3.6 mmol/L (ref 3.5–5.1)
Sodium: 137 mmol/L (ref 135–145)

## 2019-11-30 LAB — CBC WITH DIFFERENTIAL/PLATELET
Abs Immature Granulocytes: 0.01 10*3/uL (ref 0.00–0.07)
Basophils Absolute: 0.1 10*3/uL (ref 0.0–0.1)
Basophils Relative: 1 %
Eosinophils Absolute: 0.1 10*3/uL (ref 0.0–0.5)
Eosinophils Relative: 2 %
HCT: 36.6 % — ABNORMAL LOW (ref 39.0–52.0)
Hemoglobin: 12 g/dL — ABNORMAL LOW (ref 13.0–17.0)
Immature Granulocytes: 0 %
Lymphocytes Relative: 33 %
Lymphs Abs: 1.9 10*3/uL (ref 0.7–4.0)
MCH: 30.9 pg (ref 26.0–34.0)
MCHC: 32.8 g/dL (ref 30.0–36.0)
MCV: 94.3 fL (ref 80.0–100.0)
Monocytes Absolute: 0.4 10*3/uL (ref 0.1–1.0)
Monocytes Relative: 7 %
Neutro Abs: 3.3 10*3/uL (ref 1.7–7.7)
Neutrophils Relative %: 57 %
Platelets: 318 10*3/uL (ref 150–400)
RBC: 3.88 MIL/uL — ABNORMAL LOW (ref 4.22–5.81)
RDW: 13.4 % (ref 11.5–15.5)
WBC: 5.7 10*3/uL (ref 4.0–10.5)
nRBC: 0 % (ref 0.0–0.2)

## 2019-11-30 LAB — URINALYSIS, COMPLETE (UACMP) WITH MICROSCOPIC
Bacteria, UA: NONE SEEN
Bilirubin Urine: NEGATIVE
Glucose, UA: NEGATIVE mg/dL
Hgb urine dipstick: NEGATIVE
Ketones, ur: 5 mg/dL — AB
Leukocytes,Ua: NEGATIVE
Nitrite: NEGATIVE
Protein, ur: 30 mg/dL — AB
Specific Gravity, Urine: 1.019 (ref 1.005–1.030)
Squamous Epithelial / HPF: NONE SEEN (ref 0–5)
pH: 5 (ref 5.0–8.0)

## 2019-11-30 LAB — TROPONIN I (HIGH SENSITIVITY): Troponin I (High Sensitivity): 10 ng/L (ref <18)

## 2019-11-30 MED ORDER — HYDROCORTISONE ACETATE 25 MG RE SUPP: 25.0000 mg | Freq: Two times a day (BID) | RECTAL | 1 refills | Status: AC

## 2019-11-30 MED ORDER — LACTATED RINGERS IV BOLUS
1000.0000 mL | Freq: Once | INTRAVENOUS | Status: AC
  Administered 2019-11-30: 12:00:00 1000 mL via INTRAVENOUS

## 2019-11-30 MED ORDER — DOCUSATE SODIUM 100 MG PO CAPS: 100.0000 mg | ORAL_CAPSULE | Freq: Two times a day (BID) | ORAL | 2 refills | Status: DC

## 2019-11-30 NOTE — ED Provider Notes (Signed)
[facility] Emergency Department Provider Note   ____________________________________________   First MD Initiated Contact with Patient 11/30/19 1020     (approximate)  I have reviewed the triage vital signs and the nursing notes.   HISTORY  Chief Complaint Leg Pain    HPI Willie Rivas is a 69 y.o. male with past with history of hypertension who presents to the ED planing of leg pain.  Patient reports that he has been dealing with pain in both of his legs for about the past 2 months.  He describes it as an aching and throbbing that primarily occurs in both calves.  Pain seems to be worse when he goes to ambulate.  He denies any significant pain in either leg now while laying on the stretcher, and he has not noticed any significant swelling in either leg.  He has not noticed any cuts or rashes to either of his legs.  He has been evaluated for this problem on multiple occasions in the past and told it is musculoskeletal, but has never had an ultrasound.  He also complains of ongoing dysuria over the past couple of months, has not noticed any blood in his urine and denies any fevers or flank pain.  He reports feeling very lightheaded when he arrived to the ED and was noted to have a low blood pressure in triage.  He did not have any chest pain or shortness of breath with this episode and lightheadedness is now improved.        Past Medical History:  Diagnosis Date  . Hypertension     Patient Active Problem List   Diagnosis Date Noted  . Acute appendicitis with peritoneal abscess   . Small bowel obstruction [facility])     Past Surgical History:  Procedure Laterality Date  . NO PAST SURGERIES      Prior to Admission medications   Medication Sig Start Date End Date Taking? Authorizing Provider  amLODipine (NORVASC) 5 MG tablet Take 1 tablet (5 mg total) by mouth daily. 11/13/19  Yes Flinchum, Kelby Aline, FNP  docusate sodium (COLACE) 100 MG capsule Take 1  capsule (100 mg total) by mouth 2 (two) times daily. 11/30/19 02/28/20  Blake Divine, MD  hydrocortisone (ANUSOL-HC) 25 MG suppository Place 1 suppository (25 mg total) rectally every 12 (twelve) hours for 12 days. 11/30/19 12/12/19  Blake Divine, MD    Allergies Patient has no known allergies.  Family History  Problem Relation Age of Onset  . Heart disease Mother     Social History Social History   Tobacco Use  . Smoking status: Former Smoker    Quit date: 09/2015    Years since quitting: 4.2  . Smokeless tobacco: Never Used  Substance Use Topics  . Alcohol use: No  . Drug use: Never    Review of Systems  Constitutional: No fever/chills.  Positive for lightheadedness. Eyes: No visual changes. ENT: No sore throat. Cardiovascular: Denies chest pain. Respiratory: Denies shortness of breath. Gastrointestinal: No abdominal pain.  No nausea, no vomiting.  No diarrhea.  No constipation. Genitourinary: Positive for dysuria. Musculoskeletal: Negative for back pain.  Positive for leg pain. Skin: Negative for rash. Neurological: Negative for headaches, focal weakness or numbness.  ____________________________________________   PHYSICAL EXAM:  VITAL SIGNS: ED Triage Vitals  Enc Vitals Group     BP 11/30/19 1012 (!) 65/40     Pulse Rate 11/30/19 1012 63     Resp 11/30/19 1012 18  Temp 11/30/19 1012 97.8 F (36.6 C)     Temp Source 11/30/19 1012 Oral     SpO2 11/30/19 1012 95 %     Weight 11/30/19 1007 164 lb 10.9 oz (74.7 kg)     Height --      Head Circumference --      Peak Flow --      Pain Score 11/30/19 1006 7     Pain Loc --      Pain Edu? --      Excl. in [place]? --     Constitutional: Alert and oriented. Eyes: Conjunctivae are normal. Head: Atraumatic. Nose: No congestion/rhinnorhea. Mouth/Throat: Mucous membranes are moist. Neck: Normal ROM Cardiovascular: Normal rate, regular rhythm. Grossly normal heart sounds.  1+ DP pulses  bilaterally. Respiratory: Normal respiratory effort.  No retractions. Lungs CTAB. Gastrointestinal: Soft and nontender. No distention. Genitourinary: deferred Musculoskeletal: No lower extremity tenderness nor edema. Neurologic:  Normal speech and language. No gross focal neurologic deficits are appreciated. Skin:  Skin is warm, dry and intact. No rash noted. Psychiatric: Mood and affect are normal. Speech and behavior are normal.  ____________________________________________   LABS (all labs ordered are listed, but only abnormal results are displayed)  Labs Reviewed  CBC WITH DIFFERENTIAL/PLATELET - Abnormal; Notable for the following components:      Result Value   RBC 3.88 (*)    Hemoglobin 12.0 (*)    HCT 36.6 (*)    All other components within normal limits  BASIC METABOLIC PANEL - Abnormal; Notable for the following components:   Glucose, Bld 130 (*)    All other components within normal limits  URINALYSIS, COMPLETE (UACMP) WITH MICROSCOPIC - Abnormal; Notable for the following components:   Color, Urine YELLOW (*)    APPearance CLEAR (*)    Ketones, ur 5 (*)    Protein, ur 30 (*)    All other components within normal limits  TROPONIN I (HIGH SENSITIVITY)  TROPONIN I (HIGH SENSITIVITY)   ____________________________________________  EKG  ED ECG REPORT I, Blake Divine, the attending physician, personally viewed and interpreted this ECG.   Date: 11/30/2019  EKG Time: 10:27  Rate: 69  Rhythm: normal sinus rhythm  Axis: Normal  Intervals:none  ST&T Change: Lateral T wave inversions, similar to prior  PROCEDURES  Procedure(s) performed (including Critical Care):  Procedures   ____________________________________________   INITIAL IMPRESSION / ASSESSMENT AND PLAN / ED COURSE       69 year old male presents to the ED complaining of ongoing leg pain for the past couple of months, worse when he ambulates and better when he lifts his legs up onto the bed.   He does seem to have diminished pulses in both of his lower extremities, although no signs of acute ischemia.  It is possible that claudication is contributing to his pain and we will check ABI here in the ED.  While there is no swelling, will also obtain ultrasound to rule out DVT.  He was noted to be hypotensive and dizzy upon arrival, but blood pressure now appears improved and he denies any chest pain or shortness of breath with this episode.  We will screen EKG and labs, hydrate with IV fluids.  EKG and labs are unremarkable, ultrasound is negative for DVT.  ABI was performed on both lower extremities and is within normal limits while he is laying on stretcher.  Will provide vascular surgery referral to further assess for potential PAD.  Patient also now complaining of hemorrhoids, will  prescribe Anusol and Colace.  UA is unremarkable.  Counseled patient to follow-up with PCP and otherwise return to the ED for new worsening symptoms, patient agrees with plan.      ____________________________________________   FINAL CLINICAL IMPRESSION(S) / ED DIAGNOSES  Final diagnoses:  Bilateral leg pain  Claudication (HCC)  Hemorrhoids, unspecified hemorrhoid type     ED Discharge Orders         Ordered    hydrocortisone (ANUSOL-HC) 25 MG suppository  Every 12 hours     11/30/19 1304    docusate sodium (COLACE) 100 MG capsule  2 times daily     11/30/19 1304           Note:  This document was prepared using Dragon voice recognition software and may include unintentional dictation errors.   Blake Divine, MD 11/30/19 [phone]

## 2019-11-30 NOTE — ED Notes (Signed)
E-signature not working at this time. Pt verbalized understanding of D/C instructions, prescriptions and follow up care with no further questions at this time. Pt in NAD and ambulatory at time of D/C.  

## 2019-11-30 NOTE — ED Triage Notes (Signed)
C/O bilateral leg pain since January.  Seen for same in January, no improvement.  AAOx3.  Skin warm and dry.  NAD. Ambulates with easy and steady gait.

## 2019-12-10 ENCOUNTER — Other Ambulatory Visit: Payer: Self-pay

## 2019-12-10 ENCOUNTER — Ambulatory Visit (INDEPENDENT_AMBULATORY_CARE_PROVIDER_SITE_OTHER): Payer: Medicare Other | Admitting: Family Medicine

## 2019-12-10 ENCOUNTER — Encounter: Payer: Self-pay | Admitting: Family Medicine

## 2019-12-10 VITALS — BP 143/85 | HR 86 | Temp 97.3°F | Wt 163.0 lb

## 2019-12-10 DIAGNOSIS — K6289 Other specified diseases of anus and rectum: Secondary | ICD-10-CM

## 2019-12-10 DIAGNOSIS — K641 Second degree hemorrhoids: Secondary | ICD-10-CM

## 2019-12-10 MED ORDER — NITROGLYCERIN 0.4 % RE OINT: TOPICAL_OINTMENT | RECTAL | 3 refills | Status: DC

## 2019-12-10 MED ORDER — HYDROCODONE-ACETAMINOPHEN 7.5-325 MG PO TABS: 1.0000 | ORAL_TABLET | Freq: Four times a day (QID) | ORAL | 0 refills | Status: AC | PRN

## 2019-12-10 NOTE — Progress Notes (Signed)
       Patient: Willie Rivas Male    DOB: Jun 17, 1951   69 y.o.   MRN: [id number] Visit Date: 12/10/2019  Today's Provider: Lelon Huh, MD   Chief Complaint  Patient presents with  . Hemorrhoids   Subjective:     HPI  Follow up ER visit  Patient was seen in ER for rectal pain on 12/04/2019 and 11/30/2019 He was treated for external hemorrhoids. Treatment for this included started nifedipine 0.3% lidocaine 1.5% in petrolatum ointment at once visit and steroid rectal suppositories at the other.  He reports good compliance with treatment. He reports this condition is Unchanged. States is very painful and sometimes keeps him up at night. States he has been having urge to have BM several times a day, but is usually not able to have a BM. However stool is not always hard when he does have BM ------------------------------------------------------------------------------------   No Known Allergies   Current Outpatient Medications:  .  amLODipine (NORVASC) 5 MG tablet, Take 1 tablet (5 mg total) by mouth daily., Disp: 90 tablet, Rfl: 0 .  docusate sodium (COLACE) 100 MG capsule, Take 1 capsule (100 mg total) by mouth 2 (two) times daily., Disp: 60 capsule, Rfl: 2 .  hydrocortisone (ANUSOL-HC) 25 MG suppository, Place 1 suppository (25 mg total) rectally every 12 (twelve) hours for 12 days., Disp: 12 suppository, Rfl: 1  Review of Systems  Constitutional: Negative.   Respiratory: Negative.   Cardiovascular: Negative.   Gastrointestinal: Positive for rectal pain.  Musculoskeletal: Negative.     Social History   Tobacco Use  . Smoking status: Former Smoker    Quit date: 09/2015    Years since quitting: 4.2  . Smokeless tobacco: Never Used  Substance Use Topics  . Alcohol use: No      Objective:   BP (!) 143/85 (BP Location: Right Arm, Patient Position: Sitting, Cuff Size: Normal)   Pulse 86   Temp (!) 97.3 F (36.3 C) (Temporal)   Wt 163 lb (73.9 kg)   BMI 26.31 kg/m    Vitals:   12/10/19 1440  BP: (!) 143/85  Pulse: 86  Temp: (!) 97.3 F (36.3 C)  TempSrc: Temporal  Weight: 163 lb (73.9 kg)  Body mass index is 26.31 kg/m.   Physical Exam  Tender nodular lesion palpated of left rectal wall. Scant amount of blood.      Assessment & Plan    1. Grade II hemorrhoids  - Nitroglycerin 0.4 % OINT; Apply every 12 hours  Dispense: 30 g; Refill: 3 - Ambulatory referral to General Surgery  2. Rectal pain  - Nitroglycerin 0.4 % OINT; Apply every 12 hours  Dispense: 30 g; Refill: 3 - HYDROcodone-acetaminophen (NORCO) 7.5-325 MG tablet; Take 1 tablet by mouth every 6 (six) hours as needed for up to 5 days for moderate pain.  Dispense: 20 tablet; Refill: 0  Has never had colonoscopy. Advised he should be referred for colonoscopy if not done by general surgery.     Lelon Huh, MD  [facility]

## 2019-12-12 ENCOUNTER — Other Ambulatory Visit: Payer: Self-pay | Admitting: Adult Health

## 2019-12-13 ENCOUNTER — Ambulatory Visit (INDEPENDENT_AMBULATORY_CARE_PROVIDER_SITE_OTHER): Payer: Medicare Other | Admitting: General Surgery

## 2019-12-13 ENCOUNTER — Other Ambulatory Visit: Payer: Self-pay

## 2019-12-13 ENCOUNTER — Encounter: Payer: Self-pay | Admitting: General Surgery

## 2019-12-13 VITALS — BP 157/94 | HR 81 | Temp 97.7°F | Ht 66.0 in | Wt 160.0 lb

## 2019-12-13 DIAGNOSIS — K642 Third degree hemorrhoids: Secondary | ICD-10-CM | POA: Diagnosis not present

## 2019-12-13 MED ORDER — LIDOCAINE 5 % EX OINT: 1.0000 "application " | TOPICAL_OINTMENT | Freq: Three times a day (TID) | CUTANEOUS | 1 refills | Status: DC | PRN

## 2019-12-13 NOTE — Patient Instructions (Addendum)
May try using Lidocaine Jelly to help with the pain. We will see you back in one month for a follow up.  Continue soaks in the tub for comfort. May use Tucks pads or witch hazel to the area for comfort.

## 2019-12-13 NOTE — Progress Notes (Signed)
Patient ID: Willie Rivas, male   DOB: 26-Jul-1951, 69 y.o.   MRN: [id number]  Chief Complaint  Patient presents with  . New Patient (Initial Visit)    Hemorrhoids    HPI DEMONIE DRUMWRIGHT is a 69 y.o. male.   He is here today for evaluation of hemorrhoids.  He presented to the emergency department on 04 December 2019 with complaints of perianal pain.  He says that he has been having problems for about the past 3 months and that the pain has been getting worse.  When he was in the emergency department, he was found to have hemorrhoids.  He was subsequently seen by his primary care office.  Between the 2 visits, he was prescribed Anusol suppositories, nitroglycerin ointment, and Norco for pain management.  He says that he was unable to afford the nitroglycerin ointment.  He does not think the suppositories are helping.  He also reports that he has been performing sitz bath's but does not find them helpful.  He only has mild bleeding when he has a bowel movement.  He is taking a stool softener and does not endorse constipation at this time.   Past Medical History:  Diagnosis Date  . Hypertension     Past Surgical History:  Procedure Laterality Date  . NO PAST SURGERIES      Family History  Problem Relation Age of Onset  . Heart disease Mother     Social History Social History   Tobacco Use  . Smoking status: Former Smoker    Quit date: 09/2015    Years since quitting: 4.2  . Smokeless tobacco: Never Used  Substance Use Topics  . Alcohol use: No  . Drug use: Never    No Known Allergies  Current Outpatient Medications  Medication Sig Dispense Refill  . amLODipine (NORVASC) 5 MG tablet Take 1 tablet (5 mg total) by mouth daily. 90 tablet 0  . docusate sodium (COLACE) 100 MG capsule Take 1 capsule (100 mg total) by mouth 2 (two) times daily. 60 capsule 2  . HYDROcodone-acetaminophen (NORCO) 7.5-325 MG tablet Take 1 tablet by mouth every 6 (six) hours as needed for up to 5 days for  moderate pain. 20 tablet 0  . lidocaine (XYLOCAINE) 5 % ointment Apply 1 application topically 3 (three) times daily as needed. 35.44 g 1  . Nitroglycerin 0.4 % OINT Apply every 12 hours (Patient not taking: Reported on 12/13/2019) 30 g 3   No current facility-administered medications for this visit.    Review of Systems Review of Systems  All other systems reviewed and are negative.   Blood pressure (!) 157/94, pulse 81, temperature 97.7 F (36.5 C), height 5\' 6"  (1.676 m), weight 160 lb (72.6 kg), SpO2 95 %. Body mass index is 25.82 kg/m.  Physical Exam Physical Exam Vitals reviewed. Exam conducted with a chaperone present.  Constitutional:      General: He is not in acute distress.    Appearance: Normal appearance.  HENT:     Head: Normocephalic and atraumatic.     Nose:     Comments: Covered with a mask secondary to COVID-19 precautions    Mouth/Throat:     Comments: Covered with a mask secondary to COVID-19 precautions Eyes:     General: No scleral icterus.       Right eye: No discharge.        Left eye: No discharge.     Conjunctiva/sclera: Conjunctivae normal.  Cardiovascular:  Rate and Rhythm: Normal rate and regular rhythm.     Pulses: Normal pulses.  Pulmonary:     Effort: Pulmonary effort is normal.     Breath sounds: Normal breath sounds.  Abdominal:     General: Abdomen is flat. Bowel sounds are normal.     Palpations: Abdomen is soft.  Genitourinary:    Rectum: External hemorrhoid present.       Comments: Grade 3 external hemorrhoid with a small associated skin tag.  Digital rectal exam negative for gross blood.  No masses or areas of fluctuance identified. Musculoskeletal:        General: No swelling or tenderness.     Cervical back: Normal range of motion. No rigidity.  Lymphadenopathy:     Cervical: No cervical adenopathy.  Skin:    General: Skin is warm and dry.  Neurological:     General: No focal deficit present.     Mental Status: He is  alert.  Psychiatric:        Mood and Affect: Mood normal.        Behavior: Behavior normal.     Data Reviewed I reviewed the documentation from his emergency department visit on 2 February. It appears that he was supposed to have been prescribed a topical ointment with a mixture of nifedipine and viscous lidocaine.  The patient states that he did not get this medication.  He was subsequently seen in his primary care doctor's office on 8 February.  He was prescribed nitroglycerin ointment at that visit, but he did not fill the prescription secondary to expense.  Assessment This is a 69 year old man with symptomatic grade 3 external hemorrhoids.  Today, we discussed the option for surgical intervention.  He would like to try conservative management first.  We gave him a sample of 2% lidocaine jelly and also applied some to the affected area.  He will continue to perform sitz bath several times daily and after every bowel movement.  He should continue to use the Anusol suppositories.  We prescribed lidocaine ointment for him to use at home once he runs out of the samples of lidocaine jelly.  He may also use other over-the-counter remedies, such as Preparation H and Tucks medicated pads.  Plan We will see him back in 1 month's time to discuss surgery, if he does not improve with conservative measures.    Fredirick Maudlin 12/13/2019, 11:12 AM

## 2019-12-14 ENCOUNTER — Ambulatory Visit: Payer: Self-pay | Admitting: Adult Health

## 2019-12-17 ENCOUNTER — Emergency Department
Admission: EM | Admit: 2019-12-17 | Discharge: 2019-12-17 | Disposition: A | Discharge status: Home / Self Care | Payer: Medicare Other | Attending: Student | Admitting: Student

## 2019-12-17 ENCOUNTER — Encounter: Payer: Self-pay | Admitting: Emergency Medicine

## 2019-12-17 ENCOUNTER — Other Ambulatory Visit: Payer: Self-pay

## 2019-12-17 DIAGNOSIS — I10 Essential (primary) hypertension: Secondary | ICD-10-CM | POA: Insufficient documentation

## 2019-12-17 DIAGNOSIS — K641 Second degree hemorrhoids: Secondary | ICD-10-CM | POA: Diagnosis not present

## 2019-12-17 DIAGNOSIS — Z87891 Personal history of nicotine dependence: Secondary | ICD-10-CM | POA: Insufficient documentation

## 2019-12-17 DIAGNOSIS — Z79899 Other long term (current) drug therapy: Secondary | ICD-10-CM | POA: Diagnosis not present

## 2019-12-17 DIAGNOSIS — K6289 Other specified diseases of anus and rectum: Secondary | ICD-10-CM | POA: Diagnosis present

## 2019-12-17 MED ORDER — DOCUSATE SODIUM 100 MG PO CAPS: 100.0000 mg | ORAL_CAPSULE | Freq: Two times a day (BID) | ORAL | 0 refills | Status: DC

## 2019-12-17 MED ORDER — LIDOCAINE-PRILOCAINE 2.5-2.5 % EX CREA: 1.0000 "application " | TOPICAL_CREAM | CUTANEOUS | 0 refills | Status: AC | PRN

## 2019-12-17 MED ORDER — HYDROCORTISONE ACETATE 25 MG RE SUPP: 25.0000 mg | Freq: Two times a day (BID) | RECTAL | 1 refills | Status: DC

## 2019-12-17 MED ORDER — LIDOCAINE HCL URETHRAL/MUCOSAL 2 % EX GEL
1.0000 "application " | Freq: Once | CUTANEOUS | Status: AC
  Administered 2019-12-17: 1 via TOPICAL

## 2019-12-17 MED ORDER — LIDOCAINE HCL URETHRAL/MUCOSAL 2 % EX GEL
1.0000 "application " | Freq: Once | CUTANEOUS | Status: DC
  Filled 2019-12-17: qty 5

## 2019-12-17 NOTE — ED Triage Notes (Signed)
Patient presents to the ED with rectal pain from hemorrhoids x 1.5 months.  Patient states he has been using ointment but it is not improving.  Patient states he feels pain has increased in the past 2 days and patient is having difficulty sleeping.  Patient is in no obvious distress at this time.

## 2019-12-17 NOTE — ED Provider Notes (Signed)
[facility] Emergency Department Provider Note   ____________________________________________   First MD Initiated Contact with Patient 12/17/19 1359     (approximate)  I have reviewed the triage vital signs and the nursing notes.   HISTORY  Chief Complaint Hemorrhoids    HPI Willie Rivas is a 69 y.o. male patient presents with pain secondary to hemorrhoids.  Patient is a recurrent condition.  Patient requesting consult to surgery for definitive evaluation treatment.  Patient denies any bleeding associated with this complaint.  Patient rates pain as a 9/10.  Pain described pain is "achy".  No palliative measures for complaint.         Past Medical History:  Diagnosis Date  . Hypertension     Patient Active Problem List   Diagnosis Date Noted  . Acute appendicitis with peritoneal abscess   . Small bowel obstruction [facility])     Past Surgical History:  Procedure Laterality Date  . NO PAST SURGERIES      Prior to Admission medications   Medication Sig Start Date End Date Taking? Authorizing Provider  amLODipine (NORVASC) 5 MG tablet Take 1 tablet (5 mg total) by mouth daily. 11/13/19   Flinchum, Kelby Aline, FNP  docusate sodium (COLACE) 100 MG capsule Take 1 capsule (100 mg total) by mouth 2 (two) times daily. 11/30/19 02/28/20  Blake Divine, MD  docusate sodium (COLACE) 100 MG capsule Take 1 capsule (100 mg total) by mouth 2 (two) times daily. 12/17/19 12/16/20  Sable Feil, PA-C  hydrocortisone (ANUSOL-HC) 25 MG suppository Place 1 suppository (25 mg total) rectally every 12 (twelve) hours. 12/17/19 12/16/20  Sable Feil, PA-C  lidocaine (XYLOCAINE) 5 % ointment Apply 1 application topically 3 (three) times daily as needed. 12/13/19   Fredirick Maudlin, MD  lidocaine-prilocaine (EMLA) cream Apply 1 application topically as needed. 12/17/19   Sable Feil, PA-C  Nitroglycerin 0.4 % OINT Apply every 12 hours Patient not taking: Reported on  12/13/2019 12/10/19   Birdie Sons, MD    Allergies Patient has no known allergies.  Family History  Problem Relation Age of Onset  . Heart disease Mother     Social History Social History   Tobacco Use  . Smoking status: Former Smoker    Quit date: 09/2015    Years since quitting: 4.2  . Smokeless tobacco: Never Used  Substance Use Topics  . Alcohol use: No  . Drug use: Never    Review of Systems Constitutional: No fever/chills Eyes: No visual changes. ENT: No sore throat. Cardiovascular: Denies chest pain. Respiratory: Denies shortness of breath. Gastrointestinal: No abdominal pain.  No nausea, no vomiting.  No diarrhea.  No constipation.  Rectal hemorrhoids. Genitourinary: Negative for dysuria. Musculoskeletal: Negative for back pain. Skin: Negative for rash. Neurological: Negative for headaches, focal weakness or numbness. Endocrine:  Hypertension.   ____________________________________________   PHYSICAL EXAM:  VITAL SIGNS: ED Triage Vitals [12/17/19 1345]  Enc Vitals Group     BP (!) 160/95     Pulse Rate 94     Resp 18     Temp 99.2 F (37.3 C)     Temp Source Oral     SpO2 97 %     Weight 163 lb (73.9 kg)     Height 5\' 6"  (1.676 m)     Head Circumference      Peak Flow      Pain Score 9     Pain Loc  Pain Edu?      Excl. in [place]?    Constitutional: Alert and oriented. Well appearing and in no acute distress. Cardiovascular: Normal rate, regular rhythm. Grossly normal heart sounds.  Good peripheral circulation.  Elevated blood pressure. Respiratory: Normal respiratory effort.  No retractions. Lungs CTAB. Gastrointestinal: Soft and nontender. No distention. No abdominal bruits. No CVA tenderness.  Nonthrombosed external hemorrhoidal tissue Musculoskeletal: No lower extremity tenderness nor edema.  No joint effusions. Neurologic:  Normal speech and language. No gross focal neurologic deficits are appreciated. No gait instability. Skin:  Skin  is warm, dry and intact. No rash noted. Psychiatric: Mood and affect are normal. Speech and behavior are normal.  ____________________________________________   LABS (all labs ordered are listed, but only abnormal results are displayed)  Labs Reviewed - No data to display ____________________________________________  EKG   ____________________________________________  RADIOLOGY  ED MD interpretation:    Official radiology report(s): No results found.  ____________________________________________   PROCEDURES  Procedure(s) performed (including Critical Care):  Procedures   ____________________________________________   INITIAL IMPRESSION / ASSESSMENT AND PLAN / ED COURSE  As part of my medical decision making, I reviewed the following data within the [place]     Patient presents with nonthrombosed hemorrhoidal tissue.  Patient given discharge care instructions and a prescription for Anusol suppositories and Colace.  Patient advised to follow-up with surgical clinic for routine evaluation and treatment.    Willie Rivas was evaluated in Emergency Department on 12/17/2019 for the symptoms described in the history of present illness. He was evaluated in the context of the global COVID-19 pandemic, which necessitated consideration that the patient might be at risk for infection with the SARS-CoV-2 virus that causes COVID-19. Institutional protocols and algorithms that pertain to the evaluation of patients at risk for COVID-19 are in a state of rapid change based on information released by regulatory bodies including the CDC and federal and state organizations. These policies and algorithms were followed during the patient's care in the ED.       ____________________________________________   FINAL CLINICAL IMPRESSION(S) / ED DIAGNOSES  Final diagnoses:  Grade II hemorrhoids     ED Discharge Orders         Ordered    hydrocortisone (ANUSOL-HC)  25 MG suppository  Every 12 hours     12/17/19 1618    docusate sodium (COLACE) 100 MG capsule  2 times daily     12/17/19 1618    lidocaine-prilocaine (EMLA) cream  As needed     12/17/19 1618           Note:  This document was prepared using Dragon voice recognition software and may include unintentional dictation errors.    Sable Feil, PA-C 12/17/19 1625    Lilia Pro., MD 12/17/19 [phone]

## 2019-12-17 NOTE — Discharge Instructions (Addendum)
Follow discharge care instruction take medication as directed.  Call the surgical clinic in the morning to schedule appointment for definitive evaluation and treatment.

## 2019-12-17 NOTE — ED Notes (Signed)
See triage note  Presents with rectal pain  States he has had problem with hemorrhoids  Has been seen for same several times

## 2019-12-19 ENCOUNTER — Other Ambulatory Visit: Payer: Self-pay

## 2019-12-19 ENCOUNTER — Encounter: Payer: Self-pay | Admitting: Surgery

## 2019-12-19 ENCOUNTER — Other Ambulatory Visit
Admission: RE | Admit: 2019-12-19 | Discharge: 2019-12-19 | Disposition: A | Discharge status: Home / Self Care | Payer: Medicare Other | Source: Ambulatory Visit | Attending: Surgery | Admitting: Surgery

## 2019-12-19 ENCOUNTER — Ambulatory Visit (INDEPENDENT_AMBULATORY_CARE_PROVIDER_SITE_OTHER): Payer: Medicare Other | Admitting: Surgery

## 2019-12-19 ENCOUNTER — Telehealth: Payer: Self-pay | Admitting: Surgery

## 2019-12-19 VITALS — BP 137/87 | HR 82 | Temp 97.7°F | Resp 14 | Ht 66.0 in | Wt 158.2 lb

## 2019-12-19 DIAGNOSIS — Z01812 Encounter for preprocedural laboratory examination: Secondary | ICD-10-CM | POA: Diagnosis present

## 2019-12-19 DIAGNOSIS — Z20822 Contact with and (suspected) exposure to covid-19: Secondary | ICD-10-CM | POA: Insufficient documentation

## 2019-12-19 DIAGNOSIS — K648 Other hemorrhoids: Secondary | ICD-10-CM

## 2019-12-19 NOTE — Patient Instructions (Addendum)
Our surgery scheduler Marzetta Board will contact you within the next 24-48 hours. When she contacts you, please have the BLUE sheet available. At that time of the call she will discuss the days, times and preparation for the surgery with you. If you have any questions or concerns, please feel free to contact our office.   Hemorrhoids Hemorrhoids are swollen veins in and around the rectum or anus. There are two types of hemorrhoids:  Internal hemorrhoids. These occur in the veins that are just inside the rectum. They may poke through to the outside and become irritated and painful.  External hemorrhoids. These occur in the veins that are outside the anus and can be felt as a painful swelling or hard lump near the anus. Most hemorrhoids do not cause serious problems, and they can be managed with home treatments such as diet and lifestyle changes. If home treatments do not help the symptoms, procedures can be done to shrink or remove the hemorrhoids. What are the causes? This condition is caused by increased pressure in the anal area. This pressure may result from various things, including:  Constipation.  Straining to have a bowel movement.  Diarrhea.  Pregnancy.  Obesity.  Sitting for long periods of time.  Heavy lifting or other activity that causes you to strain.  Anal sex.  Riding a bike for a long period of time. What are the signs or symptoms? Symptoms of this condition include:  Pain.  Anal itching or irritation.  Rectal bleeding.  Leakage of stool (feces).  Anal swelling.  One or more lumps around the anus. How is this diagnosed? This condition can often be diagnosed through a visual exam. Other exams or tests may also be done, such as:  An exam that involves feeling the rectal area with a gloved hand (digital rectal exam).  An exam of the anal canal that is done using a small tube (anoscope).  A blood test, if you have lost a significant amount of blood.  A test to  look inside the colon using a flexible tube with a camera on the end (sigmoidoscopy or colonoscopy). How is this treated? This condition can usually be treated at home. However, various procedures may be done if dietary changes, lifestyle changes, and other home treatments do not help your symptoms. These procedures can help make the hemorrhoids smaller or remove them completely. Some of these procedures involve surgery, and others do not. Common procedures include:  Rubber band ligation. Rubber bands are placed at the base of the hemorrhoids to cut off their blood supply.  Sclerotherapy. Medicine is injected into the hemorrhoids to shrink them.  Infrared coagulation. A type of light energy is used to get rid of the hemorrhoids.  Hemorrhoidectomy surgery. The hemorrhoids are surgically removed, and the veins that supply them are tied off.  Stapled hemorrhoidopexy surgery. The surgeon staples the base of the hemorrhoid to the rectal wall. Follow these instructions at home: Eating and drinking   Eat foods that have a lot of fiber in them, such as whole grains, beans, nuts, fruits, and vegetables.  Ask your health care provider about taking products that have added fiber (fiber supplements).  Reduce the amount of fat in your diet. You can do this by eating low-fat dairy products, eating less red meat, and avoiding processed foods.  Drink enough fluid to keep your urine pale yellow. Managing pain and swelling   Take warm sitz baths for 20 minutes, 3-4 times a day to ease pain  and discomfort. You may do this in a bathtub or using a portable sitz bath that fits over the toilet.  If directed, apply ice to the affected area. Using ice packs between sitz baths may be helpful. ? Put ice in a plastic bag. ? Place a towel between your skin and the bag. ? Leave the ice on for 20 minutes, 2-3 times a day. General instructions  Take over-the-counter and prescription medicines only as told by your  health care provider.  Use medicated creams or suppositories as told.  Get regular exercise. Ask your health care provider how much and what kind of exercise is best for you. In general, you should do moderate exercise for at least 30 minutes on most days of the week (150 minutes each week). This can include activities such as walking, biking, or yoga.  Go to the bathroom when you have the urge to have a bowel movement. Do not wait.  Avoid straining to have bowel movements.  Keep the anal area dry and clean. Use wet toilet paper or moist towelettes after a bowel movement.  Do not sit on the toilet for long periods of time. This increases blood pooling and pain.  Keep all follow-up visits as told by your health care provider. This is important. Contact a health care provider if you have:  Increasing pain and swelling that are not controlled by treatment or medicine.  Difficulty having a bowel movement, or you are unable to have a bowel movement.  Pain or inflammation outside the area of the hemorrhoids. Get help right away if you have:  Uncontrolled bleeding from your rectum. Summary  Hemorrhoids are swollen veins in and around the rectum or anus.  Most hemorrhoids can be managed with home treatments such as diet and lifestyle changes.  Taking warm sitz baths can help ease pain and discomfort.  In severe cases, procedures or surgery can be done to shrink or remove the hemorrhoids. This information is not intended to replace advice given to you by your health care provider. Make sure you discuss any questions you have with your health care provider. Document Revised: 03/16/2019 Document Reviewed: 03/09/2018 Elsevier Patient Education  [place].

## 2019-12-19 NOTE — Progress Notes (Signed)
Outpatient Surgical Follow Up  12/19/2019  Willie Rivas is an 70 y.o. male.   Chief Complaint  Patient presents with  . Follow-up    hemorrhoids- painful    HPI:  Willie Rivas is a 69 year old male well-known to our practice with a history of symptomatic hemorrhoids.  He now comes in with worsening pain now is a 9-10 out of 10.  Pain is sharp currently is most of the time and worsening when he had a bowel movement.  He has used some light lidocaine cream without any significant relief.  He has also had some hematochezia that is intermittent.  No fevers no chills.  She has had a recent trip to the emergency room for the same issues. No previous anorectal procedures in the past.  He has also perform sitz bath's without any relief.    Past Medical History:  Diagnosis Date  . Hypertension     Past Surgical History:  Procedure Laterality Date  . NO PAST SURGERIES      Family History  Problem Relation Age of Onset  . Heart disease Mother     Social History:  reports that he quit smoking about 4 years ago. He has never used smokeless tobacco. He reports that he does not drink alcohol or use drugs.  Allergies: No Known Allergies  Medications reviewed.    ROS Full ROS performed and is otherwise negative other than what is stated in HPI   BP 137/87   Pulse 82   Temp 97.7 F (36.5 C) (Temporal)   Resp 14   Ht 5\' 6"  (1.676 m)   Wt 158 lb 3.2 oz (71.8 kg)   SpO2 94%   BMI 25.53 kg/m   Physical Exam Vitals and nursing note reviewed. Exam conducted with a chaperone present.  Constitutional:      General: He is not in acute distress.    Appearance: Normal appearance. He is normal weight.  Eyes:     General: No scleral icterus.       Right eye: No discharge.        Left eye: No discharge.  Cardiovascular:     Rate and Rhythm: Normal rate and regular rhythm.     Heart sounds: No murmur.  Pulmonary:     Effort: Pulmonary effort is normal. No respiratory distress.     Breath  sounds: Normal breath sounds. No stridor.  Abdominal:     General: Abdomen is flat. There is no distension.     Palpations: Abdomen is soft. There is no mass.     Tenderness: There is no abdominal tenderness. There is no guarding.     Hernia: No hernia is present.  Genitourinary:    Comments: RECTAL prolapse and ulcerated Right posterolateral hemorrhoid, exquisitely tender to palpation, external component as well. No evidence of abscess or perineal sepsis.  There is evidence of increased sphincter tone Exam is limited due to significant tenderness Musculoskeletal:        General: No swelling. Normal range of motion.     Cervical back: Normal range of motion. No rigidity or tenderness.  Skin:    General: Skin is warm and dry.     Capillary Refill: Capillary refill takes less than 2 seconds.  Neurological:     Mental Status: He is alert and oriented to person, place, and time.  Psychiatric:        Mood and Affect: Mood normal.        Behavior: Behavior normal.  Thought Content: Thought content normal.        Judgment: Judgment normal.      Assessment/Plan:  1. Internal and external ulcerated hemorrhoids 69 year old male with ulcerated and prolapsed great 3 at least internal hemorrhoid severely symptomatic.  Discussed with the patient in detail about options.  Now he is convinced that he will need hemorrhoidectomy.  I also discussed with him in detail about the postoperative course specifically about the lingering pain that the hemorrhoidectomy will cause.  He understands and wishes to proceed.  We will tentatively schedule him in the next 48hours in the OR.  The time we will perform an exam under anesthesia to make sure he does not have any other anorectal pathology.    Greater than 50% of the 40 minutes  visit was spent in counseling/coordination of care   Caroleen Hamman, MD [place] Surgeon

## 2019-12-19 NOTE — Telephone Encounter (Signed)
Pt has been advised of pre admission date/time, Covid Testing date and Surgery date.  Surgery Date: 12/21/19 Preadmission Testing Date: TODAY by 2 pm (12/19/19) Covid Testing Date: 2 hr prior to sx - patient advised to go to the [facility] ([place])  Patient has been made aware to call [phone], between 1-3:00pm the day before surgery, to find out what time to arrive.

## 2019-12-19 NOTE — H&P (View-Only) (Signed)
Outpatient Surgical Follow Up  12/19/2019  Willie Rivas is an 69 y.o. male.   Chief Complaint  Patient presents with  . Follow-up    hemorrhoids- painful    HPI:  Willie Rivas is a 69 year old male well-known to our practice with a history of symptomatic hemorrhoids.  He now comes in with worsening pain now is a 9-10 out of 10.  Pain is sharp currently is most of the time and worsening when he had a bowel movement.  He has used some light lidocaine cream without any significant relief.  He has also had some hematochezia that is intermittent.  No fevers no chills.  She has had a recent trip to the emergency room for the same issues. No previous anorectal procedures in the past.  He has also perform sitz bath's without any relief.    Past Medical History:  Diagnosis Date  . Hypertension     Past Surgical History:  Procedure Laterality Date  . NO PAST SURGERIES      Family History  Problem Relation Age of Onset  . Heart disease Mother     Social History:  reports that he quit smoking about 4 years ago. He has never used smokeless tobacco. He reports that he does not drink alcohol or use drugs.  Allergies: No Known Allergies  Medications reviewed.    ROS Full ROS performed and is otherwise negative other than what is stated in HPI   BP 137/87   Pulse 82   Temp 97.7 F (36.5 C) (Temporal)   Resp 14   Ht 5\' 6"  (1.676 m)   Wt 158 lb 3.2 oz (71.8 kg)   SpO2 94%   BMI 25.53 kg/m   Physical Exam Vitals and nursing note reviewed. Exam conducted with a chaperone present.  Constitutional:      General: He is not in acute distress.    Appearance: Normal appearance. He is normal weight.  Eyes:     General: No scleral icterus.       Right eye: No discharge.        Left eye: No discharge.  Cardiovascular:     Rate and Rhythm: Normal rate and regular rhythm.     Heart sounds: No murmur.  Pulmonary:     Effort: Pulmonary effort is normal. No respiratory distress.     Breath  sounds: Normal breath sounds. No stridor.  Abdominal:     General: Abdomen is flat. There is no distension.     Palpations: Abdomen is soft. There is no mass.     Tenderness: There is no abdominal tenderness. There is no guarding.     Hernia: No hernia is present.  Genitourinary:    Comments: RECTAL prolapse and ulcerated Right posterolateral hemorrhoid, exquisitely tender to palpation, external component as well. No evidence of abscess or perineal sepsis.  There is evidence of increased sphincter tone Exam is limited due to significant tenderness Musculoskeletal:        General: No swelling. Normal range of motion.     Cervical back: Normal range of motion. No rigidity or tenderness.  Skin:    General: Skin is warm and dry.     Capillary Refill: Capillary refill takes less than 2 seconds.  Neurological:     Mental Status: He is alert and oriented to person, place, and time.  Psychiatric:        Mood and Affect: Mood normal.        Behavior: Behavior normal.  Thought Content: Thought content normal.        Judgment: Judgment normal.      Assessment/Plan:  1. Internal and external ulcerated hemorrhoids 69 year old male with ulcerated and prolapsed great 3 at least internal hemorrhoid severely symptomatic.  Discussed with the patient in detail about options.  Now he is convinced that he will need hemorrhoidectomy.  I also discussed with him in detail about the postoperative course specifically about the lingering pain that the hemorrhoidectomy will cause.  He understands and wishes to proceed.  We will tentatively schedule him in the next 48hours in the OR.  The time we will perform an exam under anesthesia to make sure he does not have any other anorectal pathology.    Greater than 50% of the 40 minutes  visit was spent in counseling/coordination of care   Caroleen Hamman, MD [facility] Surgeon

## 2019-12-20 ENCOUNTER — Telehealth: Payer: Self-pay

## 2019-12-20 ENCOUNTER — Ambulatory Visit: Payer: Medicare Other | Admitting: General Surgery

## 2019-12-20 LAB — SARS CORONAVIRUS 2 (TAT 6-24 HRS): SARS Coronavirus 2: NEGATIVE

## 2019-12-20 MED ORDER — SODIUM CHLORIDE 0.9 % IV SOLN
2.0000 g | INTRAVENOUS | Status: AC
  Administered 2019-12-21: 12:00:00 2 g via INTRAVENOUS
  Filled 2019-12-20: qty 2

## 2019-12-20 NOTE — Telephone Encounter (Signed)
Spoke with patient -notified of negative Covid- patient reminded to call between 1-3 today for arrival time for surgery tomorrow.

## 2019-12-21 ENCOUNTER — Ambulatory Visit: Payer: Medicare Other | Admitting: Anesthesiology

## 2019-12-21 ENCOUNTER — Other Ambulatory Visit: Payer: Self-pay

## 2019-12-21 ENCOUNTER — Ambulatory Visit
Admission: RE | Admit: 2019-12-21 | Discharge: 2019-12-21 | Disposition: A | Discharge status: Home / Self Care | Payer: Medicare Other | Attending: Surgery | Admitting: Surgery

## 2019-12-21 ENCOUNTER — Encounter
Admission: RE | Disposition: A | Discharge status: Home / Self Care | Payer: Self-pay | Source: Home / Self Care | Attending: Surgery

## 2019-12-21 ENCOUNTER — Encounter: Payer: Self-pay | Admitting: Surgery

## 2019-12-21 DIAGNOSIS — I10 Essential (primary) hypertension: Secondary | ICD-10-CM | POA: Diagnosis not present

## 2019-12-21 DIAGNOSIS — K6289 Other specified diseases of anus and rectum: Secondary | ICD-10-CM

## 2019-12-21 DIAGNOSIS — C20 Malignant neoplasm of rectum: Secondary | ICD-10-CM | POA: Insufficient documentation

## 2019-12-21 DIAGNOSIS — K642 Third degree hemorrhoids: Secondary | ICD-10-CM | POA: Insufficient documentation

## 2019-12-21 DIAGNOSIS — K648 Other hemorrhoids: Secondary | ICD-10-CM | POA: Diagnosis present

## 2019-12-21 DIAGNOSIS — K644 Residual hemorrhoidal skin tags: Secondary | ICD-10-CM | POA: Diagnosis not present

## 2019-12-21 DIAGNOSIS — Z87891 Personal history of nicotine dependence: Secondary | ICD-10-CM | POA: Diagnosis not present

## 2019-12-21 DIAGNOSIS — K643 Fourth degree hemorrhoids: Secondary | ICD-10-CM | POA: Insufficient documentation

## 2019-12-21 HISTORY — PX: HEMORRHOID SURGERY: SHX153

## 2019-12-21 HISTORY — PX: RECTAL BIOPSY: SHX2303

## 2019-12-21 SURGERY — EXAM UNDER ANESTHESIA
Anesthesia: General

## 2019-12-21 MED ORDER — CHLORHEXIDINE GLUCONATE CLOTH 2 % EX PADS
6.0000 | MEDICATED_PAD | Freq: Once | CUTANEOUS | Status: AC
  Administered 2019-12-21: 6 via TOPICAL

## 2019-12-21 MED ORDER — DEXAMETHASONE SODIUM PHOSPHATE 10 MG/ML IJ SOLN
INTRAMUSCULAR | Status: AC
  Filled 2019-12-21: qty 1

## 2019-12-21 MED ORDER — LIDOCAINE HCL (PF) 2 % IJ SOLN
INTRAMUSCULAR | Status: AC
  Filled 2019-12-21: qty 10

## 2019-12-21 MED ORDER — SEVOFLURANE IN SOLN
RESPIRATORY_TRACT | Status: AC
  Filled 2019-12-21: qty 250

## 2019-12-21 MED ORDER — FENTANYL CITRATE (PF) 100 MCG/2ML IJ SOLN
INTRAMUSCULAR | Status: DC | PRN
  Administered 2019-12-21 (×3): 50 ug via INTRAVENOUS

## 2019-12-21 MED ORDER — BUPIVACAINE LIPOSOME 1.3 % IJ SUSP
INTRAMUSCULAR | Status: DC | PRN
  Administered 2019-12-21: 20 mL

## 2019-12-21 MED ORDER — MIDAZOLAM HCL 2 MG/2ML IJ SOLN
INTRAMUSCULAR | Status: AC
  Filled 2019-12-21: qty 2

## 2019-12-21 MED ORDER — FAMOTIDINE 20 MG PO TABS
ORAL_TABLET | ORAL | Status: AC
  Administered 2019-12-21: 20 mg via ORAL
  Filled 2019-12-21: qty 1

## 2019-12-21 MED ORDER — DEXAMETHASONE SODIUM PHOSPHATE 10 MG/ML IJ SOLN
INTRAMUSCULAR | Status: DC | PRN
  Administered 2019-12-21: 10 mg via INTRAVENOUS

## 2019-12-21 MED ORDER — BUPIVACAINE LIPOSOME 1.3 % IJ SUSP
INTRAMUSCULAR | Status: AC
  Filled 2019-12-21: qty 20

## 2019-12-21 MED ORDER — ONDANSETRON HCL 4 MG/2ML IJ SOLN
INTRAMUSCULAR | Status: DC | PRN
  Administered 2019-12-21: 4 mg via INTRAVENOUS

## 2019-12-21 MED ORDER — ACETAMINOPHEN 500 MG PO TABS: 1000.0000 mg | ORAL_TABLET | ORAL | Status: AC

## 2019-12-21 MED ORDER — HYDROCODONE-ACETAMINOPHEN 5-325 MG PO TABS: 1.0000 | ORAL_TABLET | Freq: Four times a day (QID) | ORAL | 0 refills | Status: DC | PRN

## 2019-12-21 MED ORDER — FENTANYL CITRATE (PF) 100 MCG/2ML IJ SOLN
INTRAMUSCULAR | Status: AC
  Filled 2019-12-21: qty 2

## 2019-12-21 MED ORDER — ONDANSETRON HCL 4 MG/2ML IJ SOLN
INTRAMUSCULAR | Status: AC
  Filled 2019-12-21: qty 2

## 2019-12-21 MED ORDER — PROPOFOL 10 MG/ML IV BOLUS
INTRAVENOUS | Status: DC | PRN
  Administered 2019-12-21: 130 mg via INTRAVENOUS

## 2019-12-21 MED ORDER — FAMOTIDINE 20 MG PO TABS: 20.0000 mg | ORAL_TABLET | Freq: Once | ORAL | Status: AC

## 2019-12-21 MED ORDER — CELECOXIB 200 MG PO CAPS
ORAL_CAPSULE | ORAL | Status: AC
  Administered 2019-12-21: 200 mg via ORAL
  Filled 2019-12-21: qty 1

## 2019-12-21 MED ORDER — SUGAMMADEX SODIUM 200 MG/2ML IV SOLN
INTRAVENOUS | Status: AC
  Filled 2019-12-21: qty 2

## 2019-12-21 MED ORDER — LACTATED RINGERS IV SOLN: INTRAVENOUS | Status: DC

## 2019-12-21 MED ORDER — ROCURONIUM BROMIDE 50 MG/5ML IV SOLN
INTRAVENOUS | Status: AC
  Filled 2019-12-21: qty 1

## 2019-12-21 MED ORDER — GABAPENTIN 300 MG PO CAPS
ORAL_CAPSULE | ORAL | Status: AC
  Administered 2019-12-21: 300 mg via ORAL
  Filled 2019-12-21: qty 1

## 2019-12-21 MED ORDER — PROPOFOL 10 MG/ML IV BOLUS
INTRAVENOUS | Status: AC
  Filled 2019-12-21: qty 20

## 2019-12-21 MED ORDER — OXYCODONE HCL 5 MG/5ML PO SOLN: 5.0000 mg | Freq: Once | ORAL | Status: DC | PRN

## 2019-12-21 MED ORDER — SUCCINYLCHOLINE CHLORIDE 20 MG/ML IJ SOLN
INTRAMUSCULAR | Status: AC
  Filled 2019-12-21: qty 1

## 2019-12-21 MED ORDER — MIDAZOLAM HCL 2 MG/2ML IJ SOLN
INTRAMUSCULAR | Status: DC | PRN
  Administered 2019-12-21: 2 mg via INTRAVENOUS

## 2019-12-21 MED ORDER — OXYCODONE HCL 5 MG PO TABS: 5.0000 mg | ORAL_TABLET | Freq: Once | ORAL | Status: DC | PRN

## 2019-12-21 MED ORDER — GABAPENTIN 300 MG PO CAPS: 300.0000 mg | ORAL_CAPSULE | ORAL | Status: AC

## 2019-12-21 MED ORDER — ROCURONIUM BROMIDE 100 MG/10ML IV SOLN
INTRAVENOUS | Status: DC | PRN
  Administered 2019-12-21 (×2): 50 mg via INTRAVENOUS

## 2019-12-21 MED ORDER — LIDOCAINE HCL (CARDIAC) PF 100 MG/5ML IV SOSY
PREFILLED_SYRINGE | INTRAVENOUS | Status: DC | PRN
  Administered 2019-12-21: 60 mg via INTRAVENOUS
  Administered 2019-12-21: 40 mg via INTRAVENOUS

## 2019-12-21 MED ORDER — FENTANYL CITRATE (PF) 100 MCG/2ML IJ SOLN: 25.0000 ug | INTRAMUSCULAR | Status: DC | PRN

## 2019-12-21 MED ORDER — PHENYLEPHRINE HCL (PRESSORS) 10 MG/ML IV SOLN
INTRAVENOUS | Status: AC
  Filled 2019-12-21: qty 1

## 2019-12-21 MED ORDER — CELECOXIB 200 MG PO CAPS: 200.0000 mg | ORAL_CAPSULE | ORAL | Status: AC

## 2019-12-21 MED ORDER — ACETAMINOPHEN 500 MG PO TABS
ORAL_TABLET | ORAL | Status: AC
  Administered 2019-12-21: 1000 mg via ORAL
  Filled 2019-12-21: qty 2

## 2019-12-21 SURGICAL SUPPLY — 30 items
BLADE CLIPPER SURG (BLADE) ×4 IMPLANT
BLADE SURG 15 STRL LF DISP TIS (BLADE) ×2 IMPLANT
BLADE SURG 15 STRL SS (BLADE) ×2
BRIEF STRETCH MATERNITY 2XLG (MISCELLANEOUS) ×4 IMPLANT
CANISTER SUCT 1200ML W/VALVE (MISCELLANEOUS) ×4 IMPLANT
CANISTER SUCT 3000ML PPV (MISCELLANEOUS) ×4 IMPLANT
COVER WAND RF STERILE (DRAPES) ×4 IMPLANT
DRAPE 3/4 80X56 (DRAPES) ×4 IMPLANT
DRAPE LEGGINS SURG 28X43 STRL (DRAPES) ×4 IMPLANT
DRAPE UNDER BUTTOCK W/FLU (DRAPES) ×4 IMPLANT
DRSG GAUZE FLUFF 36X18 (GAUZE/BANDAGES/DRESSINGS) ×4 IMPLANT
ELECT CAUTERY BLADE 6.4 (BLADE) ×4 IMPLANT
ELECT REM PT RETURN 9FT ADLT (ELECTROSURGICAL) ×4
ELECTRODE REM PT RTRN 9FT ADLT (ELECTROSURGICAL) ×2 IMPLANT
GLOVE BIO SURGEON STRL SZ7 (GLOVE) ×4 IMPLANT
GOWN STRL REUS W/ TWL LRG LVL3 (GOWN DISPOSABLE) ×4 IMPLANT
GOWN STRL REUS W/TWL LRG LVL3 (GOWN DISPOSABLE) ×4
NEEDLE HYPO 22GX1.5 SAFETY (NEEDLE) ×4 IMPLANT
NS IRRIG 1000ML POUR BTL (IV SOLUTION) ×4 IMPLANT
NS IRRIG 500ML POUR BTL (IV SOLUTION) ×4 IMPLANT
PACK BASIN MINOR ARMC (MISCELLANEOUS) ×4 IMPLANT
PAD ABD DERMACEA PRESS 5X9 (GAUZE/BANDAGES/DRESSINGS) ×4 IMPLANT
PAD PREP 24X41 OB/GYN DISP (PERSONAL CARE ITEMS) ×4 IMPLANT
SHEARS HARMONIC 9CM CVD (BLADE) ×8 IMPLANT
SOL PREP PVP 2OZ (MISCELLANEOUS) ×4
SOLUTION PREP PVP 2OZ (MISCELLANEOUS) ×2 IMPLANT
SPONGE LAP 18X18 RF (DISPOSABLE) ×8 IMPLANT
SURGILUBE 2OZ TUBE FLIPTOP (MISCELLANEOUS) ×4 IMPLANT
SYR 20ML LL LF (SYRINGE) ×4 IMPLANT
SYR BULB IRRIG 60ML STRL (SYRINGE) ×4 IMPLANT

## 2019-12-21 NOTE — Anesthesia Procedure Notes (Signed)
Procedure Name: Intubation Date/Time: 12/21/2019 12:19 PM Performed by: Jonna Clark, CRNA Pre-anesthesia Checklist: Patient identified, Patient being monitored, Timeout performed, Emergency Drugs available and Suction available Patient Re-evaluated:Patient Re-evaluated prior to induction Oxygen Delivery Method: Circle system utilized Preoxygenation: Pre-oxygenation with 100% oxygen Induction Type: IV induction Ventilation: Mask ventilation without difficulty Laryngoscope Size: 3 and McGraph Grade View: Grade I Tube type: Oral Tube size: 7.5 mm Number of attempts: 1 Airway Equipment and Method: Stylet Placement Confirmation: ETT inserted through vocal cords under direct vision,  positive ETCO2 and breath sounds checked- equal and bilateral Secured at: 23 cm Tube secured with: Tape Dental Injury: Teeth and Oropharynx as per pre-operative assessment

## 2019-12-21 NOTE — Anesthesia Preprocedure Evaluation (Addendum)
Anesthesia Evaluation  Patient identified by MRN, date of birth, ID band Patient awake    Reviewed: Allergy & Precautions, H&P , NPO status , Patient's Chart, lab work & pertinent test results  History of Anesthesia Complications Negative for: history of anesthetic complications  Airway Mallampati: II  TM Distance: >3 FB Neck ROM: full    Dental  (+) Chipped, Poor Dentition, Missing   Pulmonary neg pulmonary ROS, neg shortness of breath, former smoker,           Cardiovascular Exercise Tolerance: Good hypertension, (-) angina(-) Past MI and (-) DOE      Neuro/Psych negative neurological ROS  negative psych ROS   GI/Hepatic negative GI ROS, Neg liver ROS, neg GERD  ,  Endo/Other  negative endocrine ROS  Renal/GU      Musculoskeletal   Abdominal   Peds  Hematology negative hematology ROS (+)   Anesthesia Other Findings Past Medical History: No date: Hypertension  Past Surgical History: No date: NO PAST SURGERIES     Reproductive/Obstetrics negative OB ROS                             Anesthesia Physical Anesthesia Plan  ASA: II  Anesthesia Plan: General ETT   Post-op Pain Management:    Induction: Intravenous  PONV Risk Score and Plan: Dexamethasone, Ondansetron, Midazolam and Treatment may vary due to age or medical condition  Airway Management Planned: Oral ETT  Additional Equipment:   Intra-op Plan:   Post-operative Plan: Extubation in OR  Informed Consent: I have reviewed the patients History and Physical, chart, labs and discussed the procedure including the risks, benefits and alternatives for the proposed anesthesia with the patient or authorized representative who has indicated his/her understanding and acceptance.     Dental Advisory Given  Plan Discussed with: Anesthesiologist, CRNA and Surgeon  Anesthesia Plan Comments: (Patient consented for risks of  anesthesia including but not limited to:  - adverse reactions to medications - damage to teeth, lips or other oral mucosa - sore throat or hoarseness - Damage to heart, brain, lungs or loss of life  Patient voiced understanding.)       Anesthesia Quick Evaluation

## 2019-12-21 NOTE — Discharge Instructions (Addendum)
Surgical Procedures for Hemorrhoids, Care After This sheet gives you information about how to care for yourself after your procedure. Your health care provider may also give you more specific instructions. If you have problems or questions, contact your health care provider. What can I expect after the procedure? After the procedure, it is common to have:  Rectal pain.  Pain when you are having a bowel movement.  Slight rectal bleeding. This is more likely to happen with the first bowel movement after surgery. Follow these instructions at home: Medicines  Take over-the-counter and prescription medicines only as told by your health care provider.  If you were prescribed an antibiotic medicine, use it as told by your health care provider. Do not stop using the antibiotic even if your condition improves.  Ask your health care provider if the medicine prescribed to you requires you to avoid driving or using heavy machinery.  Use a stool softener or a bulk laxative as told by your health care provider. Eating and drinking  Follow instructions from your health care provider about what to eat or drink after your procedure.  You may need to take actions to prevent or treat constipation, such as: ? Drink enough fluid to keep your urine pale yellow. ? Take over-the-counter or prescription medicines. ? Eat foods that are high in fiber, such as beans, whole grains, and fresh fruits and vegetables. ? Limit foods that are high in fat and processed sugars, such as fried or sweet foods. Activity   Rest as told by your health care provider.  Avoid sitting for a long time without moving. Get up to take short walks every 1-2 hours. This is important to improve blood flow and breathing. Ask for help if you feel weak or unsteady.  Return to your normal activities as told by your health care provider. Ask your health care provider what activities are safe for you.  Do not lift anything that is  heavier than 10 lb (4.5 kg), or the limit that you are told, until your health care provider says that it is safe.  Do not strain to have a bowel movement.  Do not spend a long time sitting on the toilet. General instructions   Take warm sitz baths for 15-20 minutes, 2-3 times a day to relieve soreness or itching and to keep the rectal area clean.  Apply ice packs to the area to reduce swelling and pain.  Do not drive for 24 hours if you were given a sedative during your procedure.  Keep all follow-up visits as told by your health care provider. This is important. Contact a health care provider if:  Your pain medicine is not helping.  You have a fever or chills.  You have bad smelling drainage.  You have a lot of swelling.  You become constipated.  You have trouble passing urine. Get help right away if:  You have very bad rectal pain.  You have heavy bleeding from your rectum. Summary  After the procedure, it is common to have pain and slight rectal bleeding.  Take warm sitz baths for 15-20 minutes, 2-3 times a day to relieve soreness or itching and to keep the rectal area clean.  Avoid straining when having a bowel movement.  Eat foods that are high in fiber, such as beans, whole grains, and fresh fruits and vegetables.  Take over-the-counter and prescription medicines only as told by your health care provider. This information is not intended to replace advice given to  you by your health care provider. Make sure you discuss any questions you have with your health care provider.  AMBULATORY SURGERY  DISCHARGE INSTRUCTIONS   1) The drugs that you were given will stay in your system until tomorrow so for the next 24 hours you should not:  A) Drive an automobile B) Make any legal decisions C) Drink any alcoholic beverage   2) You may resume regular meals tomorrow.  Today it is better to start with liquids and gradually work up to solid foods.  You may eat  anything you prefer, but it is better to start with liquids, then soup and crackers, and gradually work up to solid foods.   3) Please notify your doctor immediately if you have any unusual bleeding, trouble breathing, redness and pain at the surgery site, drainage, fever, or pain not relieved by medication.    4) Additional Instructions:        Please contact your physician with any problems or Same Day Surgery at [phone], Monday through Friday 6 am to 4 pm, or [place] at [facility] number at [phone]. Document Revised: 04/04/2019 Document Reviewed: 09/05/2018 Elsevier Patient Education  [place].

## 2019-12-21 NOTE — Op Note (Signed)
  12/21/2019  1:06 PM  PATIENT:  Willie Rivas  69 y.o. male  PRE-OPERATIVE DIAGNOSIS:  Anorectal pain and hemorrhoids  POST-OPERATIVE DIAGNOSIS:  Same and rectal m,ass c/w [place]  PROCEDURE:   1. Exam under anesthesia 2. Rectal biopsy Left lateral rectal mass 3.  Hemorrhoidectomy x 2 Right posterolateral and right lateral   SURGEON:  Surgeon(s) and Role:    * Gal Smolinski F, MD - Primary   ANESTHESIA: GETA  FINDINGS: 1. Grade IV and III internal external hemorrhoids Right posterolateral and right antero lateral 2. 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 Risks, benefits , possible complications and a consent was obtained. The patient was taken to the operating room and placed in the lithotomy position.  He was prepped and draped in the usual fashion.  Exam under anesthesia revealed evidence of a rectal mass 1.5 cm from the anal verge and located in the left lateral wall of the rectum.  This was consistent with adenocarcinoma.  It was friable and ulcerated.  Also find to hemorrhoidal cushions 1 prominent grade 4 hemorrhoid on the posterior lateral position and another one grade 3 in the anterolateral position. Using the speculum was able to perform excisional biopsy of the rectal mass and this was sent for permanent pathology. Attention then was turned to the hemorrhoidal cushions where they were elevated with an Allis clamp and using the Harmonic a scalpel they were removed in the standard fashion.  There was very good hemostasis.  Liposomal Marcaine was placed around the incision sites.  There were no complications.     Needle and laparotomy counts were correct and there were no immediate complications  Jules Husbands, MD

## 2019-12-21 NOTE — Transfer of Care (Signed)
Immediate Anesthesia Transfer of Care Note  Patient: Willie Rivas  Procedure(s) Performed: Jasmine December UNDER ANESTHESIA (N/A ) HEMORRHOIDECTOMY (N/A )  Patient Location: PACU  Anesthesia Type:General  Level of Consciousness: drowsy and patient cooperative  Airway & Oxygen Therapy: Patient Spontanous Breathing and Patient connected to face mask oxygen  Post-op Assessment: Report given to RN and Post -op Vital signs reviewed and stable  Post vital signs: Reviewed and stable  Last Vitals:  Vitals Value Taken Time  BP 124/82 12/21/19 1314  Temp    Pulse 79 12/21/19 1315  Resp 17 12/21/19 1315  SpO2 99 % 12/21/19 1315  Vitals shown include unvalidated device data.  Last Pain:  Vitals:   12/21/19 0810  TempSrc: Temporal  PainSc: 8          Complications: No apparent anesthesia complications

## 2019-12-21 NOTE — Interval H&P Note (Signed)
History and Physical Interval Note:  12/21/2019 11:42 AM  Willie Rivas  has presented today for surgery, with the diagnosis of Hemorrhoid initial.  The various methods of treatment have been discussed with the patient and family. After consideration of risks, benefits and other options for treatment, the patient has consented to  Procedure(s): EXAM UNDER ANESTHESIA (N/A) HEMORRHOIDECTOMY (N/A) as a surgical intervention.  The patient's history has been reviewed, patient examined, no change in status, stable for surgery.  I have reviewed the patient's chart and labs.  Questions were answered to the patient's satisfaction.   Depending on OR availability Dr. Celine Ahr might do this case for me.  [place]

## 2019-12-22 NOTE — Anesthesia Postprocedure Evaluation (Signed)
Anesthesia Post Note  Patient: Conchita Paris  Procedure(s) Performed: EXAM UNDER ANESTHESIA (N/A ) HEMORRHOIDECTOMY (N/A ) BIOPSY RECTAL  Patient location during evaluation: PACU Anesthesia Type: General Level of consciousness: awake and alert Pain management: pain level controlled Vital Signs Assessment: post-procedure vital signs reviewed and stable Respiratory status: spontaneous breathing, nonlabored ventilation, respiratory function stable and patient connected to nasal cannula oxygen Cardiovascular status: blood pressure returned to baseline and stable Postop Assessment: no apparent nausea or vomiting Anesthetic complications: no     Last Vitals:  Vitals:   12/21/19 1406 12/21/19 1428  BP: (!) 174/94 (!) 151/86  Pulse: 75 73  Resp: 16 18  Temp: 36.8 C   SpO2: 94% 95%    Last Pain:  Vitals:   12/21/19 1428  TempSrc:   PainSc: 0-No pain                 Precious Haws Monnie Gudgel

## 2019-12-24 ENCOUNTER — Other Ambulatory Visit: Payer: Self-pay

## 2019-12-24 ENCOUNTER — Telehealth: Payer: Self-pay

## 2019-12-24 DIAGNOSIS — K648 Other hemorrhoids: Secondary | ICD-10-CM

## 2019-12-24 DIAGNOSIS — C20 Malignant neoplasm of rectum: Secondary | ICD-10-CM

## 2019-12-24 DIAGNOSIS — K644 Residual hemorrhoidal skin tags: Secondary | ICD-10-CM

## 2019-12-24 NOTE — Telephone Encounter (Signed)
Notified patient to have labs done- CEA, CMP, CBC, INR.  CT abd/pelvis scheduled 12/28/19 @ 10:45 [facility]. Nothing by mouth 4 hours prior. Please pick up prep kit the day before your scan at any Radiology department of [facility].   Colonoscopy scheduled for  01/01/2020 with Dr.Kiran Vicente Males at [facility]. They will call you to discuss your prep. If you have not heard from their office by Friday please call their office at [phone].  Follow up with Dr.Pabon 01/09/20 @ 11:00 am.     Patient verbalized all appointment times and test to be done.  Office number provided in case he has questions.

## 2019-12-25 ENCOUNTER — Other Ambulatory Visit
Admission: RE | Admit: 2019-12-25 | Discharge: 2019-12-25 | Disposition: A | Discharge status: Home / Self Care | Payer: Medicare Other | Source: Ambulatory Visit | Attending: Surgery | Admitting: Surgery

## 2019-12-25 ENCOUNTER — Telehealth: Payer: Self-pay | Admitting: Surgery

## 2019-12-25 DIAGNOSIS — C20 Malignant neoplasm of rectum: Secondary | ICD-10-CM | POA: Diagnosis present

## 2019-12-25 DIAGNOSIS — K648 Other hemorrhoids: Secondary | ICD-10-CM | POA: Diagnosis present

## 2019-12-25 LAB — COMPREHENSIVE METABOLIC PANEL
ALT: 47 U/L — ABNORMAL HIGH (ref 0–44)
AST: 61 U/L — ABNORMAL HIGH (ref 15–41)
Albumin: 2.7 g/dL — ABNORMAL LOW (ref 3.5–5.0)
Alkaline Phosphatase: 228 U/L — ABNORMAL HIGH (ref 38–126)
Anion gap: 8 (ref 5–15)
BUN: 13 mg/dL (ref 8–23)
CO2: 28 mmol/L (ref 22–32)
Calcium: 9.1 mg/dL (ref 8.9–10.3)
Chloride: 100 mmol/L (ref 98–111)
Creatinine, Ser: 0.77 mg/dL (ref 0.61–1.24)
GFR calc Af Amer: >60 mL/min (ref >60)
GFR calc non Af Amer: >60 mL/min (ref >60)
Glucose, Bld: 126 mg/dL — ABNORMAL HIGH (ref 70–99)
Potassium: 3.8 mmol/L (ref 3.5–5.1)
Sodium: 136 mmol/L (ref 135–145)
Total Bilirubin: 1.1 mg/dL (ref 0.3–1.2)
Total Protein: 6.9 g/dL (ref 6.5–8.1)

## 2019-12-25 LAB — CBC WITH DIFFERENTIAL/PLATELET
Abs Immature Granulocytes: 0.04 10*3/uL (ref 0.00–0.07)
Basophils Absolute: 0 10*3/uL (ref 0.0–0.1)
Basophils Relative: 0 %
Eosinophils Absolute: 0 10*3/uL (ref 0.0–0.5)
Eosinophils Relative: 0 %
HCT: 34.4 % — ABNORMAL LOW (ref 39.0–52.0)
Hemoglobin: 11.3 g/dL — ABNORMAL LOW (ref 13.0–17.0)
Immature Granulocytes: 1 %
Lymphocytes Relative: 13 %
Lymphs Abs: 1.1 10*3/uL (ref 0.7–4.0)
MCH: 29.8 pg (ref 26.0–34.0)
MCHC: 32.8 g/dL (ref 30.0–36.0)
MCV: 90.8 fL (ref 80.0–100.0)
Monocytes Absolute: 0.6 10*3/uL (ref 0.1–1.0)
Monocytes Relative: 8 %
Neutro Abs: 6.7 10*3/uL (ref 1.7–7.7)
Neutrophils Relative %: 78 %
Platelets: 511 10*3/uL — ABNORMAL HIGH (ref 150–400)
RBC: 3.79 MIL/uL — ABNORMAL LOW (ref 4.22–5.81)
RDW: 13.4 % (ref 11.5–15.5)
WBC: 8.5 10*3/uL (ref 4.0–10.5)
nRBC: 0 % (ref 0.0–0.2)

## 2019-12-25 LAB — PROTIME-INR
INR: 1.2 (ref 0.8–1.2)
Prothrombin Time: 14.9 seconds (ref 11.4–15.2)

## 2019-12-25 MED ORDER — GABAPENTIN 300 MG PO CAPS: 300.0000 mg | ORAL_CAPSULE | Freq: Three times a day (TID) | ORAL | 0 refills | Status: DC

## 2019-12-25 NOTE — Telephone Encounter (Signed)
Spoke with Dr.Pabon and he states he will sent in prescription for Gabapentin 300 mg TID #30 to help patient with pain management. He also recommends patient to take OTC Ibuprofen 800 mg TID along with narcotics. Patient was also advised to try [place]. Patient denies an appointment says he would like to try the medication. Patient advised to contact our office if he has any problems or concerns or if the medication isn't working for him. Patient verbalizes understanding.

## 2019-12-25 NOTE — Telephone Encounter (Signed)
Patient is calling and is complaining of having some pain, patient said his pain level is at 10. Please call patient and advise.

## 2019-12-26 ENCOUNTER — Other Ambulatory Visit: Payer: Self-pay

## 2019-12-26 ENCOUNTER — Telehealth: Payer: Self-pay

## 2019-12-26 DIAGNOSIS — K644 Residual hemorrhoidal skin tags: Secondary | ICD-10-CM

## 2019-12-26 DIAGNOSIS — C20 Malignant neoplasm of rectum: Secondary | ICD-10-CM

## 2019-12-26 DIAGNOSIS — K648 Other hemorrhoids: Secondary | ICD-10-CM

## 2019-12-26 LAB — SURGICAL PATHOLOGY

## 2019-12-26 LAB — CEA: CEA: 103 ng/mL — ABNORMAL HIGH (ref 0.0–4.7)

## 2019-12-26 MED ORDER — NA SULFATE-K SULFATE-MG SULF 17.5-3.13-1.6 GM/177ML PO SOLN: 1.0000 | Freq: Once | ORAL | 0 refills | Status: AC

## 2019-12-26 NOTE — Telephone Encounter (Signed)
Spoke with patient to let him know Dr.Pabon has added CT chest, of which can be done same time as CT abdomen/pelvis. Patient had labs done yesterday, however did not pick up prep kit from Radiology.  Patient was strongly encouraged to pick up prep kit today, or tomorrow at the latest otherwise we would need to reschedule imaging studies. Patient verbalized he would go get prep kit.

## 2019-12-27 ENCOUNTER — Telehealth: Payer: Self-pay | Admitting: *Deleted

## 2019-12-27 NOTE — Telephone Encounter (Signed)
Spoke with patient and advised patient to try cold compress to the area and recommend patient try to soak area in a tub of warm water and patient can also try ibuprofen or tylenol to help with the inflammation and reduce any fevers he may be experiencing. Did explain to patient that this type of surgery can be very painful for a few days to a week(s). Patient verbalizes understanding. Also, advised to give our office a call if he has any questions or concerns to arise.

## 2019-12-27 NOTE — Telephone Encounter (Signed)
Patient called and stated that he is hurting bad in his rectum all day, cant sleep, waking up in sweats, chills and fever but he said he never checked it. Started since after surgery. He did have a bowel movement today and it cause him a lot of pain. Please call and advise   Surgery 12/21/19 Dr Dahlia Byes hemorrhoidectomy/BX rectal

## 2019-12-27 NOTE — Telephone Encounter (Signed)
Spoke with patient he has prep kit and instructions for CT Abd/Pelvis/Chest 12/28/19.  I let him know someone from [facility] office would be calling to discuss Colonoscopy appointment. He was instructed to bring family member to his follow up appointment with Dr.Pabon 01/09/20.

## 2019-12-28 ENCOUNTER — Other Ambulatory Visit
Admission: RE | Admit: 2019-12-28 | Discharge: 2019-12-28 | Disposition: A | Discharge status: Home / Self Care | Payer: Medicare Other | Source: Ambulatory Visit | Attending: Gastroenterology | Admitting: Gastroenterology

## 2019-12-28 ENCOUNTER — Ambulatory Visit: Payer: Medicare Other

## 2019-12-28 ENCOUNTER — Other Ambulatory Visit: Payer: Self-pay

## 2019-12-28 ENCOUNTER — Ambulatory Visit
Admission: RE | Admit: 2019-12-28 | Discharge: 2019-12-28 | Disposition: A | Discharge status: Home / Self Care | Payer: Medicare Other | Source: Ambulatory Visit | Attending: Surgery | Admitting: Surgery

## 2019-12-28 DIAGNOSIS — Z20822 Contact with and (suspected) exposure to covid-19: Secondary | ICD-10-CM | POA: Diagnosis not present

## 2019-12-28 DIAGNOSIS — C20 Malignant neoplasm of rectum: Secondary | ICD-10-CM

## 2019-12-28 LAB — SARS CORONAVIRUS 2 (TAT 6-24 HRS): SARS Coronavirus 2: NEGATIVE

## 2019-12-28 MED ORDER — IOHEXOL 300 MG/ML  SOLN
100.0000 mL | Freq: Once | INTRAMUSCULAR | Status: AC | PRN
  Administered 2019-12-28: 100 mL via INTRAVENOUS

## 2019-12-29 ENCOUNTER — Encounter: Payer: Self-pay | Admitting: Emergency Medicine

## 2019-12-29 ENCOUNTER — Other Ambulatory Visit: Payer: Self-pay

## 2019-12-29 ENCOUNTER — Emergency Department
Admission: EM | Admit: 2019-12-29 | Discharge: 2019-12-29 | Disposition: A | Discharge status: Home / Self Care | Payer: Medicare Other | Attending: Emergency Medicine | Admitting: Emergency Medicine

## 2019-12-29 DIAGNOSIS — K6289 Other specified diseases of anus and rectum: Secondary | ICD-10-CM | POA: Diagnosis present

## 2019-12-29 DIAGNOSIS — I10 Essential (primary) hypertension: Secondary | ICD-10-CM | POA: Insufficient documentation

## 2019-12-29 DIAGNOSIS — K649 Unspecified hemorrhoids: Secondary | ICD-10-CM | POA: Diagnosis not present

## 2019-12-29 DIAGNOSIS — Z87891 Personal history of nicotine dependence: Secondary | ICD-10-CM | POA: Insufficient documentation

## 2019-12-29 DIAGNOSIS — Z79899 Other long term (current) drug therapy: Secondary | ICD-10-CM | POA: Diagnosis not present

## 2019-12-29 MED ORDER — DOCUSATE SODIUM 100 MG PO CAPS: 100.0000 mg | ORAL_CAPSULE | Freq: Two times a day (BID) | ORAL | 0 refills | Status: DC

## 2019-12-29 MED ORDER — OXYCODONE HCL 5 MG PO TABS: 5.0000 mg | ORAL_TABLET | ORAL | 0 refills | Status: DC | PRN

## 2019-12-29 MED ORDER — OXYCODONE-ACETAMINOPHEN 7.5-325 MG PO TABS
1.0000 | ORAL_TABLET | Freq: Once | ORAL | Status: AC
  Administered 2019-12-29: 1 via ORAL
  Filled 2019-12-29: qty 1

## 2019-12-29 MED ORDER — LIDOCAINE 5 % EX OINT: 1.0000 "application " | TOPICAL_OINTMENT | Freq: Three times a day (TID) | CUTANEOUS | 1 refills | Status: AC | PRN

## 2019-12-29 NOTE — ED Provider Notes (Signed)
[facility] Emergency Department Provider Note  ____________________________________________   First MD Initiated Contact with Patient 12/29/19 1320     (approximate)  I have reviewed the triage vital signs and the nursing notes.   HISTORY  Chief Complaint Hemorrhoids   HPI Willie Rivas is a 69 y.o. male presents to the ED with complaint of hemorrhoid pain.  Patient states that his hemorrhoids were operated on last Friday and that he continues to have pain.  He denies any bleeding or other drainage.  He states that he was using a cream that did not seem to help.  He denies any fever or chills.  He also continues to have regular bowel movements without change in color.  No history of constipation however patient states each time he has a bowel movement "it hurts".  He also reports he supposed to have a colonoscopy but may not go because this also would be painful.  Patient also admits that he has not taken his blood pressure medication today.  Currently rates his pain as a 10/10.       Past Medical History:  Diagnosis Date  . Hypertension     Patient Active Problem List   Diagnosis Date Noted  . Acute appendicitis with peritoneal abscess   . Small bowel obstruction [facility])     Past Surgical History:  Procedure Laterality Date  . HEMORRHOID SURGERY N/A 12/21/2019   Procedure: HEMORRHOIDECTOMY;  Surgeon: Jules Husbands, MD;  Location: [facility] ORS;  Service: General;  Laterality: N/A;  . NO PAST SURGERIES    . RECTAL BIOPSY  12/21/2019   Procedure: BIOPSY RECTAL;  Surgeon: Jules Husbands, MD;  Location: [facility] ORS;  Service: General;;    Prior to Admission medications   Medication Sig Start Date End Date Taking? Authorizing Provider  acetaminophen (TYLENOL) 500 MG tablet Take 1,000 mg by mouth every 6 (six) hours as needed for moderate pain or headache.    [provider]  amLODipine (NORVASC) 5 MG tablet Take 1 tablet (5 mg total) by mouth daily.  11/13/19   Flinchum, Kelby Aline, FNP  docusate sodium (COLACE) 100 MG capsule Take 1 capsule (100 mg total) by mouth 2 (two) times daily. 12/29/19 12/28/20  Johnn Hai, PA-C  gabapentin (NEURONTIN) 300 MG capsule Take 1 capsule (300 mg total) by mouth 3 (three) times daily. 12/25/19   Pabon, Diego F, MD  hydrocortisone (ANUSOL-HC) 25 MG suppository Place 1 suppository (25 mg total) rectally every 12 (twelve) hours. Patient not taking: Reported on 12/19/2019 12/17/19 12/16/20  Sable Feil, PA-C  lidocaine (XYLOCAINE) 5 % ointment Apply 1 application topically 3 (three) times daily as needed. 12/29/19   Johnn Hai, PA-C  lidocaine-prilocaine (EMLA) cream Apply 1 application topically as needed. 12/17/19   Sable Feil, PA-C  Nitroglycerin 0.4 % OINT Apply every 12 hours Patient not taking: Reported on 12/19/2019 12/10/19   Birdie Sons, MD  oxyCODONE (OXY IR/ROXICODONE) 5 MG immediate release tablet Take 1 tablet (5 mg total) by mouth every 4 (four) hours as needed for severe pain. 12/29/19   Johnn Hai, PA-C    Allergies Patient has no known allergies.  Family History  Problem Relation Age of Onset  . Heart disease Mother     Social History Social History   Tobacco Use  . Smoking status: Former Smoker    Quit date: 09/2015    Years since quitting: 4.3  . Smokeless tobacco: Never Used  Substance Use Topics  . Alcohol use: No  . Drug use: Never    Review of Systems Constitutional: No fever/chills Cardiovascular: Denies chest pain. Respiratory: Denies shortness of breath. Gastrointestinal: No abdominal pain.  No nausea, no vomiting.  No diarrhea.  No constipation.  Positive for rectal pain.  Positive for history of hemorrhoids. Genitourinary: Negative for dysuria. Musculoskeletal: Negative for back pain. Skin: Negative for rash. Neurological: Negative for headaches, focal weakness or numbness. ____________________________________________   PHYSICAL  EXAM:  VITAL SIGNS: ED Triage Vitals  Enc Vitals Group     BP 12/29/19 1246 (!) 162/101     Pulse Rate 12/29/19 1246 90     Resp 12/29/19 1246 18     Temp 12/29/19 1246 99.6 F (37.6 C)     Temp Source 12/29/19 1246 Oral     SpO2 12/29/19 1246 97 %     Weight 12/29/19 1247 158 lb 4.6 oz (71.8 kg)     Height 12/29/19 1247 5\' 6"  (1.676 m)     Head Circumference --      Peak Flow --      Pain Score 12/29/19 1247 10     Pain Loc --      Pain Edu? --      Excl. in [place]? --    Constitutional: Alert and oriented. Well appearing and in no acute distress. Eyes: Conjunctivae are normal.  Head: Atraumatic. Neck: No stridor.   Cardiovascular: Normal rate, regular rhythm. Grossly normal heart sounds.  Good peripheral circulation. Respiratory: Normal respiratory effort.  No retractions. Lungs CTAB. Gastrointestinal: Soft and nontender. No distention.  Examination of the rectal area is negative for external hemorrhoids, bleeding or drainage.  There is an area that appears to be healing without any evidence of infection.  Area is tender to palpation.  Skin is soft. Musculoskeletal: Moves upper and lower extremities without any difficulty.  Neurologic:  Normal speech and language. No gross focal neurologic deficits are appreciated.  Skin:  Skin is warm, dry and intact. No rash noted. Psychiatric: Mood and affect are normal. Speech and behavior are normal.  ____________________________________________   LABS (all labs ordered are listed, but only abnormal results are displayed)  Labs Reviewed - No data to display  PROCEDURES  Procedure(s) performed (including Critical Care):  Procedures   ____________________________________________   INITIAL IMPRESSION / ASSESSMENT AND PLAN / ED COURSE  69 year old male presents to the ED with complaint of rectal pain after hemorrhoid surgery last Friday.  In talking with him he states that the medication that he has at home has not helped with his  pain.  Review of his medications shows that he only had 15 tablets and is questionable if he has any left.  Patient denies any constipation but states that bowel movements have been painful.  He continues to take Colace and reports that his stools are soft.  He denies any drainage or bleeding.  He remarked during exam that he did not believe he was going to go for his colonoscopy as he was already getting of pain.  He is strongly encouraged to keep that appointment and also to follow-up with his surgeon and gastroenterologist.  A prescription for Colace was sent to his pharmacy along with lidocaine 5% ointment and oxycodone 5 mg every 4 hours if needed for pain.  Patient is aware that this is a narcotic and could increase chances of becoming constipated.  He agrees to continue with Colace to prevent this.  ____________________________________________  FINAL CLINICAL IMPRESSION(S) / ED DIAGNOSES  Final diagnoses:  Rectal pain     ED Discharge Orders         Ordered    docusate sodium (COLACE) 100 MG capsule  2 times daily     12/29/19 1419    lidocaine (XYLOCAINE) 5 % ointment  3 times daily PRN     12/29/19 1419    oxyCODONE (OXY IR/ROXICODONE) 5 MG immediate release tablet  Every 4 hours PRN     12/29/19 1419           Note:  This document was prepared using Dragon voice recognition software and may include unintentional dictation errors.    Johnn Hai, PA-C 12/29/19 1515    Lavonia Drafts, MD 12/29/19 [phone]

## 2019-12-29 NOTE — ED Triage Notes (Signed)
Pt to ER with c/o hemorrhoid pain.  States had "them operated on last Friday" and the pain is no better.  States he has followed all care suggestions with no improvement.  Pt denies bleeding or other drainage.  Pt states has not taken BP meds today.

## 2019-12-29 NOTE — Discharge Instructions (Signed)
On Monday call Dr. Janit Pagan office to let them know that you are still having pain from your surgery.  Also I strongly advise you to keep your appointment for your colonoscopy as this may help answer some questions about your pain.  Continue taking Colace twice a day to keep your bowel movements soft as a new narcotic was sent to your pharmacy.  This medication could increase your chances for constipation which will make it very difficult for you to have a bowel movement without pain.  Also you may soak in a tub.  A prescription for lidocaine 5% jelly was also sent that you can use as needed for pain directly to the area.

## 2019-12-31 ENCOUNTER — Telehealth: Payer: Self-pay

## 2019-12-31 DIAGNOSIS — C20 Malignant neoplasm of rectum: Secondary | ICD-10-CM

## 2019-12-31 NOTE — Telephone Encounter (Signed)
Tried reaching patient- mailbox full unable to leave message- Patient scheduled 01/02/2020 @ 2:15 with Dr.Pabon.

## 2019-12-31 NOTE — Telephone Encounter (Signed)
Left message on son [name]'s VM to call office regarding appointment 01/02/20. Wanting to let patient know a follow up appointment has been scheduled to discuss all results with Dr.Pabon.

## 2020-01-01 ENCOUNTER — Encounter: Payer: Self-pay | Admitting: Anesthesiology

## 2020-01-01 ENCOUNTER — Encounter: Admission: RE | Payer: Self-pay | Source: Home / Self Care

## 2020-01-01 ENCOUNTER — Telehealth: Payer: Self-pay

## 2020-01-01 ENCOUNTER — Ambulatory Visit: Admission: RE | Admit: 2020-01-01 | Payer: Medicare Other | Source: Home / Self Care | Admitting: Gastroenterology

## 2020-01-01 SURGERY — COLONOSCOPY WITH PROPOFOL
Anesthesia: General

## 2020-01-01 NOTE — Telephone Encounter (Signed)
Call to patient to inform of appointment for 01/02/20 with Dr Dahlia Byes. Unable to leave a message as his voicemail is full. I called and spoke with his son, Willie Rivas. He has not seen the patient since Saturday, 12/29/19. He did give me the number of his mother who may be able to contact the patient. I will attempt to give her a call.

## 2020-01-01 NOTE — Telephone Encounter (Signed)
Call made to patient's ex wife, Willie Rivas, in an attempt to get in contact with the patient. She is aware of what is going on medically with the patient as he has been confiding in her. She will attempt to get a hold of the patient today and call us back. She reports that the patient has been very upset about his diagnosis.   She spoke with the patient and he asks that we call him back today on his phone at 2:45 pm.

## 2020-01-01 NOTE — Telephone Encounter (Signed)
I called and was able to speak with the patient and he is aware of his appointment tomorrow at 2:30 pm with Dr Dahlia Byes.

## 2020-01-02 ENCOUNTER — Encounter: Payer: Medicare Other | Admitting: Surgery

## 2020-01-02 ENCOUNTER — Other Ambulatory Visit: Payer: Self-pay

## 2020-01-02 ENCOUNTER — Ambulatory Visit (INDEPENDENT_AMBULATORY_CARE_PROVIDER_SITE_OTHER): Payer: Medicare Other | Admitting: Surgery

## 2020-01-02 ENCOUNTER — Encounter: Payer: Self-pay | Admitting: Surgery

## 2020-01-02 VITALS — BP 106/68 | HR 99 | Temp 98.1°F | Resp 14 | Ht 66.0 in | Wt 151.6 lb

## 2020-01-02 DIAGNOSIS — C801 Malignant (primary) neoplasm, unspecified: Secondary | ICD-10-CM

## 2020-01-02 DIAGNOSIS — C20 Malignant neoplasm of rectum: Secondary | ICD-10-CM

## 2020-01-02 MED ORDER — NA SULFATE-K SULFATE-MG SULF 17.5-3.13-1.6 GM/177ML PO SOLN: 1.0000 | Freq: Once | ORAL | 0 refills | Status: AC

## 2020-01-02 NOTE — Patient Instructions (Addendum)
Please keep your appointment with Dr Grayland Ormond (Oncology) on 01/04/20 at [place].   Your Colonoscopy is rescheduled for 01/07/20 with Dr Vicente Males at the [facility] . You will need to get a COVID test done on 01/03/20 between 10:30amd-12:30pm at the [facility].  Please pick up your prep prescriptions that the [facility] sent to your pharmacy.  Please complete the Bowel Prep the day prior to your Colonscopy. If you have any questions in regards to your Prep call Sharyn Lull at [phone].   We have sent a referral for you to see a Colorectal Surgeon Dr. Marcello Moores.   You do not need a follow up at this time with Dr Dahlia Byes. Please call the office if you have any questions or concerns.

## 2020-01-03 ENCOUNTER — Encounter: Payer: Self-pay | Admitting: Radiation Oncology

## 2020-01-03 ENCOUNTER — Other Ambulatory Visit: Payer: Self-pay

## 2020-01-04 ENCOUNTER — Inpatient Hospital Stay: Payer: Medicare Other

## 2020-01-04 ENCOUNTER — Encounter: Payer: Self-pay | Admitting: Emergency Medicine

## 2020-01-04 ENCOUNTER — Encounter: Payer: Self-pay | Admitting: Oncology

## 2020-01-04 ENCOUNTER — Inpatient Hospital Stay: Payer: Medicare Other | Attending: Oncology | Admitting: Oncology

## 2020-01-04 ENCOUNTER — Telehealth: Payer: Self-pay

## 2020-01-04 ENCOUNTER — Inpatient Hospital Stay: Payer: Medicare Other | Admitting: Hospice and Palliative Medicine

## 2020-01-04 ENCOUNTER — Ambulatory Visit
Admission: RE | Admit: 2020-01-04 | Discharge: 2020-01-04 | Disposition: A | Discharge status: Home / Self Care | Payer: Medicare Other | Source: Ambulatory Visit | Attending: Radiation Oncology | Admitting: Radiation Oncology

## 2020-01-04 ENCOUNTER — Other Ambulatory Visit: Payer: Self-pay

## 2020-01-04 VITALS — BP 135/84 | HR 110 | Temp 99.2°F | Wt 150.2 lb

## 2020-01-04 VITALS — BP 135/84 | HR 73 | Temp 97.6°F | Resp 20 | Wt 145.2 lb

## 2020-01-04 DIAGNOSIS — C20 Malignant neoplasm of rectum: Secondary | ICD-10-CM

## 2020-01-04 DIAGNOSIS — Z7189 Other specified counseling: Secondary | ICD-10-CM

## 2020-01-04 DIAGNOSIS — R630 Anorexia: Secondary | ICD-10-CM | POA: Insufficient documentation

## 2020-01-04 DIAGNOSIS — N179 Acute kidney failure, unspecified: Secondary | ICD-10-CM | POA: Diagnosis not present

## 2020-01-04 DIAGNOSIS — R634 Abnormal weight loss: Secondary | ICD-10-CM | POA: Insufficient documentation

## 2020-01-04 DIAGNOSIS — Z87891 Personal history of nicotine dependence: Secondary | ICD-10-CM | POA: Diagnosis not present

## 2020-01-04 DIAGNOSIS — C7800 Secondary malignant neoplasm of unspecified lung: Secondary | ICD-10-CM | POA: Insufficient documentation

## 2020-01-04 DIAGNOSIS — I10 Essential (primary) hypertension: Secondary | ICD-10-CM | POA: Diagnosis not present

## 2020-01-04 DIAGNOSIS — Z79899 Other long term (current) drug therapy: Secondary | ICD-10-CM | POA: Insufficient documentation

## 2020-01-04 DIAGNOSIS — Z7952 Long term (current) use of systemic steroids: Secondary | ICD-10-CM | POA: Insufficient documentation

## 2020-01-04 DIAGNOSIS — C787 Secondary malignant neoplasm of liver and intrahepatic bile duct: Secondary | ICD-10-CM | POA: Insufficient documentation

## 2020-01-04 LAB — COMPREHENSIVE METABOLIC PANEL
ALT: 84 U/L — ABNORMAL HIGH (ref 0–44)
AST: 399 U/L — ABNORMAL HIGH (ref 15–41)
Albumin: 2.5 g/dL — ABNORMAL LOW (ref 3.5–5.0)
Alkaline Phosphatase: 520 U/L — ABNORMAL HIGH (ref 38–126)
Anion gap: 15 (ref 5–15)
BUN: 31 mg/dL — ABNORMAL HIGH (ref 8–23)
CO2: 21 mmol/L — ABNORMAL LOW (ref 22–32)
Calcium: 8.6 mg/dL — ABNORMAL LOW (ref 8.9–10.3)
Chloride: 93 mmol/L — ABNORMAL LOW (ref 98–111)
Creatinine, Ser: 1.53 mg/dL — ABNORMAL HIGH (ref 0.61–1.24)
GFR calc Af Amer: 53 mL/min — ABNORMAL LOW (ref >60)
GFR calc non Af Amer: 46 mL/min — ABNORMAL LOW (ref >60)
Glucose, Bld: 128 mg/dL — ABNORMAL HIGH (ref 70–99)
Potassium: 4.4 mmol/L (ref 3.5–5.1)
Sodium: 129 mmol/L — ABNORMAL LOW (ref 135–145)
Total Bilirubin: 2.1 mg/dL — ABNORMAL HIGH (ref 0.3–1.2)
Total Protein: 7.5 g/dL (ref 6.5–8.1)

## 2020-01-04 LAB — CBC WITH DIFFERENTIAL/PLATELET
Abs Immature Granulocytes: 0.04 10*3/uL (ref 0.00–0.07)
Basophils Absolute: 0 10*3/uL (ref 0.0–0.1)
Basophils Relative: 0 %
Eosinophils Absolute: 0 10*3/uL (ref 0.0–0.5)
Eosinophils Relative: 0 %
HCT: 31.3 % — ABNORMAL LOW (ref 39.0–52.0)
Hemoglobin: 10.2 g/dL — ABNORMAL LOW (ref 13.0–17.0)
Immature Granulocytes: 0 %
Lymphocytes Relative: 10 %
Lymphs Abs: 0.9 10*3/uL (ref 0.7–4.0)
MCH: 29.1 pg (ref 26.0–34.0)
MCHC: 32.6 g/dL (ref 30.0–36.0)
MCV: 89.2 fL (ref 80.0–100.0)
Monocytes Absolute: 0.7 10*3/uL (ref 0.1–1.0)
Monocytes Relative: 7 %
Neutro Abs: 7.5 10*3/uL (ref 1.7–7.7)
Neutrophils Relative %: 83 %
Platelets: 605 10*3/uL — ABNORMAL HIGH (ref 150–400)
RBC: 3.51 MIL/uL — ABNORMAL LOW (ref 4.22–5.81)
RDW: 14.5 % (ref 11.5–15.5)
WBC: 9.2 10*3/uL (ref 4.0–10.5)
nRBC: 0 % (ref 0.0–0.2)

## 2020-01-04 MED ORDER — OXYCODONE HCL 10 MG PO TABS: 10.0000 mg | ORAL_TABLET | Freq: Four times a day (QID) | ORAL | 0 refills | Status: AC | PRN

## 2020-01-04 MED ORDER — DEXAMETHASONE 4 MG PO TABS: 4.0000 mg | ORAL_TABLET | Freq: Every day | ORAL | 2 refills | Status: AC

## 2020-01-04 NOTE — Progress Notes (Signed)
Met with Mr. Goyer and his son before and during consult with Dr. Hildred Laser. Introduced Therapist, nutritional and provided contact information for future needs.  -No referral to surgery at this time (stage IV)  -MMR/RAS/RAF not performed on biopsy. These were requested today.  -Denies food insecurities. Has access to food. Decreased appetite, Weight loss of 15 lbs since 10/2019. Albumin 2.7. Numerous liver mets. Referred to dietician.  -Pain control. Poor historian regarding pain meds. Stated he did not get meds from pharmacy. Called [facility] and he did pick up last script of Oxycodone with no refills. He is currently out of pain mediation. He does have EMLA and lidocaine. Did not get Nitroglycerin. Referral to palliative care.  Transportation-States he is not currently driving. Son brought him to appointment today. Son is moving to [place] soon. He will be having 10 radiation treatments. States he will have no issues with getting to appointments. Edcucated on [facility] services and encouraged him to let us know if transportation becomes an issue.  Will refer back to Dr. Dahlia Byes for port placement. His office notified and they will arrange.  Went over next steps: Labs today Colonoscopy 3/8 Radiation simulation 3/9 Return on 3/12 to see Dr. Grayland Ormond and palliative care. Dexamethasone and Oxycodone were sent to his pharmacy. Educated further on these medications.

## 2020-01-04 NOTE — Progress Notes (Signed)
Outpatient Surgical Follow Up  01/04/2020  Willie Rivas is an 69 y.o. male.   Chief Complaint  Patient presents with  . Routine Post Op    post op exam under anesth. sx 12/21/19 Dr.Otniel Hoe    HPI: Willie Rivas is a 69 year old male with severe rectal pain initially attributed to hemorrhoids examined anesthesia revealed a large bulky rectal cancer only a couple of centimeters from the anal verge.  note that I have personally discussed with the patient about the pathology findings.  I have ordered a CT scan of the abdomen and pelvis as well as CT of the chest that I have personally reviewed and evidence of a rectal mass with distant metastatic disease in the liver pelvic nodes and lungs. He continues to have rectal pain.  No evidence of bowel obstruction.  He is eating well with no nausea no vomiting and there is no evidence of obstruction on the CT.  Past Medical History:  Diagnosis Date  . Hypertension     Past Surgical History:  Procedure Laterality Date  . HEMORRHOID SURGERY N/A 12/21/2019   Procedure: HEMORRHOIDECTOMY;  Surgeon: Jules Husbands, MD;  Location: [facility] ORS;  Service: General;  Laterality: N/A;  . NO PAST SURGERIES    . RECTAL BIOPSY  12/21/2019   Procedure: BIOPSY RECTAL;  Surgeon: Jules Husbands, MD;  Location: [facility] ORS;  Service: General;;    Family History  Problem Relation Age of Onset  . Heart disease Mother     Social History:  reports that he quit smoking about 4 years ago. He has never used smokeless tobacco. He reports that he does not drink alcohol or use drugs.  Allergies: No Known Allergies  Medications reviewed.    ROS Full ROS performed and is otherwise negative other than what is stated in HPI   BP 106/68   Pulse 99   Temp 98.1 F (36.7 C)   Resp 14   Ht 5\' 6"  (1.676 m)   Wt 151 lb 9.6 oz (68.8 kg)   SpO2 94%   BMI 24.47 kg/m   Physical Exam Vitals and nursing note reviewed. Exam conducted with a chaperone present.  Constitutional:    General: He is not in acute distress.    Appearance: He is normal weight.  Eyes:     General: No scleral icterus.       Right eye: No discharge.        Left eye: No discharge.  Cardiovascular:     Rate and Rhythm: Normal rate and regular rhythm.  Pulmonary:     Effort: Pulmonary effort is normal. No respiratory distress.     Breath sounds: Normal breath sounds.  Abdominal:     General: Abdomen is flat. There is no distension.     Palpations: There is no mass.     Tenderness: There is no abdominal tenderness. There is no right CVA tenderness, guarding or rebound.     Hernia: No hernia is present.  Musculoskeletal:        General: Normal range of motion.     Cervical back: Normal range of motion and neck supple.  Skin:    General: Skin is warm and dry.     Capillary Refill: Capillary refill takes less than 2 seconds.  Neurological:     General: No focal deficit present.     Mental Status: He is alert and oriented to person, place, and time.  Psychiatric:        Mood and  Affect: Mood normal.        Behavior: Behavior normal.        Thought Content: Thought content normal.        Judgment: Judgment normal.        Assessment/Plan: 69 year old male with recently diagnosed low rectal cancer with distant metastatic disease to the liver and the lungs.  This is an unfortunate situation.  We have already started the staging process with a CT of the chest and abdomen.  He unfortunately have missed multiple appointments and he attributes this to his forgetfulness.  He was supposed to have a full colonoscopy yesterday which she did not attend.  He does have an upcoming appointment with Dr. Grayland Ormond from oncology.  I have discussed with him in detail that right now surgery will not give him any benefit.  He is not obstructed therefore does not necessarily need an immediate diverting colostomy. Rectal pain is very difficult to manage and I will touch base with oncology regarding this issue.   May need to be referred to the pain clinic.  I am not sure if actually radiation will worsen his pain For completeness I will also request colorectal surgery referral.  By no means this is urgent. Greater than 50% of the 40 minutes  visit was spent in counseling/coordination of care please note that all my care was independent and should not be considered as part of a regular postoperative visit given the complexity of the case.   Caroleen Hamman, MD [facility] General Surgeon

## 2020-01-04 NOTE — Telephone Encounter (Signed)
Referral, office notes, demographics, imaging notes all faxed to Dr.Alicia Marcello Rivas -patient has appointment 01/14/2020 @ 9 am. Patient notified.

## 2020-01-04 NOTE — Research (Signed)
I met with patient and son today to discuss research study "Blood Sample Collection to Evaluate Biomarkers in Subjects with Untreated Solid Tumors" sponsored by [employer]. I explained purpose of study and study requirements of giving 5 vials of blood and answering family history questions.  Patient states he is not interested as he is "already so weak."  Patient's son had no input in conversation. I thanked patient for considering the trial and wished him well.   [facility] Research Associate 01/04/2020 11:26 AM

## 2020-01-04 NOTE — Progress Notes (Signed)
Pt here for new patient appt, referral from Dr. Dahlia Byes for rectal adenocarcinoma.

## 2020-01-04 NOTE — Consult Note (Signed)
NEW PATIENT EVALUATION  Name: Willie Rivas  MRN: [id number]  Date:   01/04/2020     DOB: Nov 17, 1950   This 69 y.o. male patient presents to the clinic for initial evaluation of palliative radiation therapy for stage IV rectal cancer.  REFERRING PHYSICIAN: Flinchum, Kelby Aline, F*  CHIEF COMPLAINT:  Chief Complaint  Patient presents with  . Rectal Cancer    DIAGNOSIS: The encounter diagnosis was Rectal cancer ([place]).   PREVIOUS INVESTIGATIONS:  CT scans reviewed Pathology report reviewed Clinical notes reviewed  HPI: Patient is a 69 year old male complains of hemorrhoids and an anal rectal pain.  He was taken to the OR where he was found to have a left lateral rectal mass.  Biopsy was positive for poorly differentiated adenocarcinoma.  Work-up including CT scans of chest abdomen pelvis showed irregular masslike thickening involving lateral aspect of the rectum compatible with primary rectal carcinoma he also had innumerable liver metastasis enlarged hemipelvic lymph nodes as well as bilateral pulmonary nodules concerning for pulmonary metastatic disease.  He is seen today for consideration of palliative treatment secondary to significant pain in the rectal area.  He is also seen medical oncology today for consideration of palliative chemotherapy.  PLANNED TREATMENT REGIMEN: Palliative radiation therapy to rectum  PAST MEDICAL HISTORY:  has a past medical history of Hypertension.    PAST SURGICAL HISTORY:  Past Surgical History:  Procedure Laterality Date  . HEMORRHOID SURGERY N/A 12/21/2019   Procedure: HEMORRHOIDECTOMY;  Surgeon: Jules Husbands, MD;  Location: [facility] ORS;  Service: General;  Laterality: N/A;  . NO PAST SURGERIES    . RECTAL BIOPSY  12/21/2019   Procedure: BIOPSY RECTAL;  Surgeon: Jules Husbands, MD;  Location: [facility] ORS;  Service: General;;    FAMILY HISTORY: family history includes Heart disease in his mother.  SOCIAL HISTORY:  reports that he quit smoking about 4  years ago. He has never used smokeless tobacco. He reports that he does not drink alcohol or use drugs.  ALLERGIES: Patient has no known allergies.  MEDICATIONS:  Current Outpatient Medications  Medication Sig Dispense Refill  . acetaminophen (TYLENOL) 500 MG tablet Take 1,000 mg by mouth every 6 (six) hours as needed for moderate pain or headache.    Marland Kitchen amLODipine (NORVASC) 5 MG tablet Take 1 tablet (5 mg total) by mouth daily. 90 tablet 0  . docusate sodium (COLACE) 100 MG capsule Take 1 capsule (100 mg total) by mouth 2 (two) times daily. 20 capsule 0  . gabapentin (NEURONTIN) 300 MG capsule Take 1 capsule (300 mg total) by mouth 3 (three) times daily. 30 capsule 0  . hydrocortisone (ANUSOL-HC) 25 MG suppository Place 1 suppository (25 mg total) rectally every 12 (twelve) hours. 12 suppository 1  . lidocaine (XYLOCAINE) 5 % ointment Apply 1 application topically 3 (three) times daily as needed. 35.44 g 1  . lidocaine-prilocaine (EMLA) cream Apply 1 application topically as needed. 30 g 0  . Nitroglycerin 0.4 % OINT Apply every 12 hours 30 g 3  . dexamethasone (DECADRON) 4 MG tablet Take 1 tablet (4 mg total) by mouth daily. 30 tablet 2  . Oxycodone HCl 10 MG TABS Take 1 tablet (10 mg total) by mouth every 6 (six) hours as needed. 90 tablet 0   No current facility-administered medications for this encounter.    ECOG PERFORMANCE STATUS:  2 - Symptomatic, <50% confined to bed  REVIEW OF SYSTEMS: Patient denies any weight loss, fatigue, weakness, fever, chills or night  sweats. Patient denies any loss of vision, blurred vision. Patient denies any ringing  of the ears or hearing loss. No irregular heartbeat. Patient denies heart murmur or history of fainting. Patient denies any chest pain or pain radiating to her upper extremities. Patient denies any shortness of breath, difficulty breathing at night, cough or hemoptysis. Patient denies any swelling in the lower legs. Patient denies any nausea  vomiting, vomiting of blood, or coffee ground material in the vomitus. Patient denies any stomach pain. Patient states has had normal bowel movements no significant constipation or diarrhea. Patient denies any dysuria, hematuria or significant nocturia. Patient denies any problems walking, swelling in the joints or loss of balance. Patient denies any skin changes, loss of hair or loss of weight. Patient denies any excessive worrying or anxiety or significant depression. Patient denies any problems with insomnia. Patient denies excessive thirst, polyuria, polydipsia. Patient denies any swollen glands, patient denies easy bruising or easy bleeding. Patient denies any recent infections, allergies or URI. Patient "s visual fields have not changed significantly in recent time.   PHYSICAL EXAM: BP 135/84   Pulse 73   Temp 97.6 F (36.4 C) (Tympanic)   Resp 20   Wt 145 lb 3.2 oz (65.9 kg)   BMI 23.44 kg/m  [place] male wheelchair-bound in NAD.  Well-developed well-nourished patient in NAD. HEENT reveals PERLA, EOMI, discs not visualized.  Oral cavity is clear. No oral mucosal lesions are identified. Neck is clear without evidence of cervical or supraclavicular adenopathy. Lungs are clear to A&P. Cardiac examination is essentially unremarkable with regular rate and rhythm without murmur rub or thrill. Abdomen is benign with no organomegaly or masses noted. Motor sensory and DTR levels are equal and symmetric in the upper and lower extremities. Cranial nerves II through XII are grossly intact. Proprioception is intact. No peripheral adenopathy or edema is identified. No motor or sensory levels are noted. Crude visual fields are within normal range.  LABORATORY DATA: Pathology report reviewed    RADIOLOGY RESULTS: CT scans reviewed compatible with above-stated findings   IMPRESSION: Stage IV rectal cancer with significant rectal pain and evidence of pulmonary and hepatic metastasis in 69 year old  male  PLAN: Present time I have recommended a short course of palliative radiation therapy.  We will plan delivering 3000 cGy in 10 fractions to the rectal area involving the area of involvement by CT criteria.  Do not think we need to treat pelvic nodes since this is stage IV disease with hepatic and pulmonary metastasis.  Risks and benefits of treatment including possible diarrhea fatigue alteration of blood counts possible slight chance of increased lower urinary tract symptoms and skin reaction all were discussed with the patient.  I have also discussed the case personally with medical oncology who will be recommending palliative chemotherapy.  Patient comprehends my treatment plan well I personally set up and ordered CT simulation for early next week.  I would like to take this opportunity to thank you for allowing me to participate in the care of your patient.Noreene Filbert, MD

## 2020-01-04 NOTE — Telephone Encounter (Signed)
Patient called office- stated he is returning a call, however no one from this office has called him- I let him know that I would make some calls to the upcoming offices that he has has an appointment- I spoke with Clarise Cruz at [facility] office- she stated she was not aware of the referral and had not received any paperwork- . She made an appointment with Dr.Thomas - I will let Dr.Finnegans office know of the appointment and they can let the patient know at his 10 am appointment today.  Spoke with [employer], [employer] -they did not call the patient- I spoke with Herb Grays at [place] office- patient has appointment today at 9 am- Herb Grays will let him know his appointment with Rennis Chris [phone] at 9 am. Address and contact number provided- I also asked Herb Grays to let him know I think the call was from the hospital letting him know about his benefits.   Office notes to be faxed to  [facility] office now.

## 2020-01-04 NOTE — Addendum Note (Signed)
Addended by: Caroleen Hamman F on: 01/04/2020 03:03 PM   Modules accepted: Orders, SmartSet

## 2020-01-05 LAB — CEA: CEA: 587 ng/mL — ABNORMAL HIGH (ref 0.0–4.7)

## 2020-01-06 DIAGNOSIS — C20 Malignant neoplasm of rectum: Secondary | ICD-10-CM | POA: Insufficient documentation

## 2020-01-06 DIAGNOSIS — Z7189 Other specified counseling: Secondary | ICD-10-CM | POA: Insufficient documentation

## 2020-01-06 NOTE — Progress Notes (Deleted)
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  INTERVAL HISTORY: Patient is a 69 year old male who noticed intense rectal pain over the past several months.  He also noted a significant amount of unintentional weight loss over the same timeframe.  Patient subsequently underwent surgery for possible hemorrhoidectomy, but was noted to have a large rectal mass.  Subsequent biopsy was consistent with malignancy.  Further imaging revealed widespread disease.  Patient has persistent pain.  His performance status is declining.  He has no neurologic complaints.  He denies any recent fevers or illnesses.  He has a poor appetite and weight loss.  He denies any chest pain, shortness of breath, cough, or hemoptysis.  He denies any nausea, vomiting, constipation, or diarrhea.  He does not endorse any melena or hematochezia.  He has no urinary complaints.  Patient feels generally terrible, but offers no further specific complaints today.    REVIEW OF SYSTEMS:   Review of Systems  Constitutional: Positive for malaise/fatigue and weight loss. Negative for fever.  Respiratory: Negative.  Negative for cough, hemoptysis and shortness of breath.   Cardiovascular: Negative.  Negative for chest pain and leg swelling.  Gastrointestinal: Negative for abdominal pain, blood in stool, constipation and melena.       Rectal pain  Genitourinary: Negative.  Negative for dysuria.  Musculoskeletal: Negative.  Negative for back pain.  Skin: Negative.  Negative for rash.  Neurological: Positive for weakness. Negative for dizziness, focal weakness and headaches.  Psychiatric/Behavioral: Negative.  The patient is not  nervous/anxious.     As per HPI. Otherwise, a complete review of systems is negative.  PAST MEDICAL HISTORY: Past Medical History:  Diagnosis Date  . Hypertension     PAST SURGICAL HISTORY: Past Surgical History:  Procedure Laterality Date  . HEMORRHOID SURGERY N/A 12/21/2019   Procedure: HEMORRHOIDECTOMY;  Surgeon: Jules Husbands, MD;  Location: [facility] ORS;  Service: General;  Laterality: N/A;  . NO PAST SURGERIES    . RECTAL BIOPSY  12/21/2019   Procedure: BIOPSY RECTAL;  Surgeon: Jules Husbands, MD;  Location: [facility] ORS;  Service: General;;    FAMILY HISTORY: Family History  Problem Relation Age of Onset  . Heart disease Mother     ADVANCED DIRECTIVES (Y/N):  N  HEALTH MAINTENANCE: Social History   Tobacco Use  . Smoking status: Former Smoker    Quit date: 09/2015    Years since quitting: 4.3  . Smokeless tobacco: Never Used  Substance Use Topics  . Alcohol use: No  . Drug use: Never     Colonoscopy:  PAP:  Bone density:  Lipid panel:  No Known Allergies  Current Outpatient Medications  Medication Sig Dispense Refill  . acetaminophen (TYLENOL) 500 MG tablet Take 1,000 mg by mouth every 6 (six) hours as needed for moderate pain or headache.    Marland Kitchen amLODipine (NORVASC) 5 MG tablet Take 1 tablet (5 mg total) by mouth daily. 90 tablet 0  . dexamethasone (DECADRON) 4 MG tablet Take 1 tablet (4 mg total) by mouth daily. 30 tablet 2  . docusate sodium (COLACE) 100 MG capsule Take 1 capsule (100 mg total) by mouth 2 (two) times daily. 20 capsule 0  . gabapentin (  NEURONTIN) 300 MG capsule Take 1 capsule (300 mg total) by mouth 3 (three) times daily. 30 capsule 0  . hydrocortisone (ANUSOL-HC) 25 MG suppository Place 1 suppository (25 mg total) rectally every 12 (twelve) hours. 12 suppository 1  . lidocaine (XYLOCAINE) 5 % ointment Apply 1 application topically 3 (three) times daily as needed. 35.44 g 1  . lidocaine-prilocaine (EMLA) cream Apply 1 application topically as  needed. 30 g 0  . Nitroglycerin 0.4 % OINT Apply every 12 hours 30 g 3  . Oxycodone HCl 10 MG TABS Take 1 tablet (10 mg total) by mouth every 6 (six) hours as needed. 90 tablet 0   No current facility-administered medications for this visit.    OBJECTIVE: There were no vitals filed for this visit.   There is no height or weight on file to calculate BMI.    ECOG FS:3 - Symptomatic, >50% confined to bed  General: Thin, mild discomfort secondary to pain.  Sitting in a wheelchair. Eyes: Pink conjunctiva, anicteric sclera. HEENT: Normocephalic, moist mucous membranes. Lungs: No audible wheezing or coughing. Heart: Regular rate and rhythm. Abdomen: Soft, nontender, no obvious distention. Musculoskeletal: No edema, cyanosis, or clubbing. Neuro: Alert, answering all questions appropriately. Cranial nerves grossly intact. Skin: No rashes or petechiae noted. Psych: Normal affect. Lymphatics: No cervical, calvicular, axillary or inguinal LAD.   LAB RESULTS:  Lab Results  Component Value Date   NA 129 (L) 01/04/2020   K 4.4 01/04/2020   CL 93 (L) 01/04/2020   CO2 21 (L) 01/04/2020   GLUCOSE 128 (H) 01/04/2020   BUN 31 (H) 01/04/2020   CREATININE 1.53 (H) 01/04/2020   CALCIUM 8.6 (L) 01/04/2020   PROT 7.5 01/04/2020   ALBUMIN 2.5 (L) 01/04/2020   AST 399 (H) 01/04/2020   ALT 84 (H) 01/04/2020   ALKPHOS 520 (H) 01/04/2020   BILITOT 2.1 (H) 01/04/2020   GFRNONAA 46 (L) 01/04/2020   GFRAA 53 (L) 01/04/2020    Lab Results  Component Value Date   WBC 9.2 01/04/2020   NEUTROABS 7.5 01/04/2020   HGB 10.2 (L) 01/04/2020   HCT 31.3 (L) 01/04/2020   MCV 89.2 01/04/2020   PLT 605 (H) 01/04/2020     STUDIES: CT Chest W Contrast  Result Date: 12/28/2019 CLINICAL DATA:  Staging rectal carcinoma. EXAM: CT CHEST, ABDOMEN, AND PELVIS WITH CONTRAST TECHNIQUE: Multidetector CT imaging of the chest, abdomen and pelvis was performed following the standard protocol during bolus administration  of intravenous contrast. CONTRAST:  163mL OMNIPAQUE IOHEXOL 300 MG/ML  SOLN COMPARISON:  CT abdomen pelvis 09/19/2016 FINDINGS: CT CHEST FINDINGS Cardiovascular: Normal heart size. Trace fluid superior pericardial recess. Thoracic aortic vascular calcifications. Mediastinum/Nodes: No enlarged axillary, mediastinal or hilar lymphadenopathy. Normal appearance of the esophagus. Lungs/Pleura: Central airways are patent. Bandlike atelectasis within the bilateral lower lobes. Multiple bilateral pulmonary nodules are demonstrated. Reference 5 mm subpleural left lower lobe nodule (image 107; series 4). Reference 7 mm right lower lobe nodule (image 99; series 4). No pleural effusion or pneumothorax. Musculoskeletal: Thoracic spine degenerative changes. No aggressive or acute appearing osseous lesions. CT ABDOMEN PELVIS FINDINGS Hepatobiliary: The liver is enlarged. There are innumerable low-attenuation lesions throughout the liver compatible with hepatic metastasis. Reference lesion within the posterior right hepatic lobe measures 3.1 x 2.5 cm (image 47; series 2). Reference lesion within the left hepatic lobe measures 3.9 x 2.5 cm (image 48; series 2). Gallbladder is unremarkable. No intrahepatic or extrahepatic biliary ductal dilatation. Pancreas: Unremarkable Spleen: Unremarkable Adrenals/Urinary Tract:  Normal adrenal glands. Kidneys enhance symmetrically with contrast. Small cyst superior pole left kidney. Urinary bladder is unremarkable. Stomach/Bowel: Oral contrast material throughout the small and large bowel. There is irregular masslike thickening involving lateral aspect of the rectum measuring up to 3.6 x 2.9 cm (image 112; series 2). No evidence for small bowel obstruction. Normal morphology of the stomach. No free fluid or free intraperitoneal air. Vascular/Lymphatic: Normal caliber abdominal aorta. Peripheral calcified atherosclerotic plaque. There is a 1.2 cm left hemi pelvic node adjacent to the rectum (image  108; series 2). There is a 1.3 cm left hemi pelvic lymph node (image 103; series 2). There is an abnormal appearing 0.8 cm left hemi pelvic node (image 96; series 2). Reproductive: Heterogeneous prostate with dystrophic calcifications. Other: Small bilateral fat containing hernias. Musculoskeletal: Lumbar spine degenerative changes. No aggressive or acute appearing osseous lesions. IMPRESSION: 1. Irregular masslike thickening involving the lateral aspect of the rectum compatible with primary rectal carcinoma. 2. Innumerable low-attenuation lesions throughout the liver compatible with hepatic metastasis. 3. Enlarged left hemi pelvic lymph nodes compatible with metastatic disease. 4. Multiple bilateral pulmonary nodules concerning for pulmonary metastatic disease. 5. Aortic Atherosclerosis (ICD10-I70.0). Electronically Signed   By: Lovey Newcomer M.D.   On: 12/28/2019 15:49   CT ABDOMEN PELVIS W CONTRAST  Result Date: 12/28/2019 CLINICAL DATA:  Staging rectal carcinoma. EXAM: CT CHEST, ABDOMEN, AND PELVIS WITH CONTRAST TECHNIQUE: Multidetector CT imaging of the chest, abdomen and pelvis was performed following the standard protocol during bolus administration of intravenous contrast. CONTRAST:  116mL OMNIPAQUE IOHEXOL 300 MG/ML  SOLN COMPARISON:  CT abdomen pelvis 09/19/2016 FINDINGS: CT CHEST FINDINGS Cardiovascular: Normal heart size. Trace fluid superior pericardial recess. Thoracic aortic vascular calcifications. Mediastinum/Nodes: No enlarged axillary, mediastinal or hilar lymphadenopathy. Normal appearance of the esophagus. Lungs/Pleura: Central airways are patent. Bandlike atelectasis within the bilateral lower lobes. Multiple bilateral pulmonary nodules are demonstrated. Reference 5 mm subpleural left lower lobe nodule (image 107; series 4). Reference 7 mm right lower lobe nodule (image 99; series 4). No pleural effusion or pneumothorax. Musculoskeletal: Thoracic spine degenerative changes. No aggressive or  acute appearing osseous lesions. CT ABDOMEN PELVIS FINDINGS Hepatobiliary: The liver is enlarged. There are innumerable low-attenuation lesions throughout the liver compatible with hepatic metastasis. Reference lesion within the posterior right hepatic lobe measures 3.1 x 2.5 cm (image 47; series 2). Reference lesion within the left hepatic lobe measures 3.9 x 2.5 cm (image 48; series 2). Gallbladder is unremarkable. No intrahepatic or extrahepatic biliary ductal dilatation. Pancreas: Unremarkable Spleen: Unremarkable Adrenals/Urinary Tract: Normal adrenal glands. Kidneys enhance symmetrically with contrast. Small cyst superior pole left kidney. Urinary bladder is unremarkable. Stomach/Bowel: Oral contrast material throughout the small and large bowel. There is irregular masslike thickening involving lateral aspect of the rectum measuring up to 3.6 x 2.9 cm (image 112; series 2). No evidence for small bowel obstruction. Normal morphology of the stomach. No free fluid or free intraperitoneal air. Vascular/Lymphatic: Normal caliber abdominal aorta. Peripheral calcified atherosclerotic plaque. There is a 1.2 cm left hemi pelvic node adjacent to the rectum (image 108; series 2). There is a 1.3 cm left hemi pelvic lymph node (image 103; series 2). There is an abnormal appearing 0.8 cm left hemi pelvic node (image 96; series 2). Reproductive: Heterogeneous prostate with dystrophic calcifications. Other: Small bilateral fat containing hernias. Musculoskeletal: Lumbar spine degenerative changes. No aggressive or acute appearing osseous lesions. IMPRESSION: 1. Irregular masslike thickening involving the lateral aspect of the rectum compatible with primary rectal carcinoma.  2. Innumerable low-attenuation lesions throughout the liver compatible with hepatic metastasis. 3. Enlarged left hemi pelvic lymph nodes compatible with metastatic disease. 4. Multiple bilateral pulmonary nodules concerning for pulmonary metastatic  disease. 5. Aortic Atherosclerosis (ICD10-I70.0). Electronically Signed   By: Lovey Newcomer M.D.   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  PLAN:    1. 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  Case is also been discussed with surgery.  Given his declining performance status, worsening liver and renal function it is unlikely patient will be able to tolerate treatments.  He was evaluated by radiation oncology today and will benefit from palliative XRT for his rectal pain.  Return to clinic in 1 week for further evaluation, laboratory work, and to discuss whether treatment is even an option.  Patient will also have palliative care consult next week. 2.  Weight loss: Patient was given a prescription for dexamethasone 4 mg daily.  He has also been referred to dietary. 3.  Pain: XRT as above.  Patient was also given oxycodone 10 mg every 4-6 hours. 4.  Acute renal failure: Likely secondary to poor fluid intake and progression of disease. 5.  Worsening liver function: Patient's AST, ALT, and bilirubin are all trending up likely secondary to progression of disease.  Continue to monitor closely repeat laboratory work in 1 week.  I spent a total of 60 minutes reviewing chart data, face-to-face evaluation with the patient, counseling and coordination of care as detailed above.    Patient expressed understanding and was in agreement with this plan. He also understands that He can call clinic at any time with any questions, concerns, or complaints.   Cancer Staging Rectal adenocarcinoma [facility]) Staging form: Colon and Rectum, AJCC 8th Edition - Clinical stage from 01/06/2020: Stage IVB (cTX, cNX, pM1b) - Signed by Lloyd Huger, MD on 01/06/2020   Lloyd Huger, MD   01/06/2020 7:02 PM

## 2020-01-06 NOTE — Progress Notes (Signed)
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  INTERVAL HISTORY: Patient is a 69 year old male who noticed intense rectal pain over the past several months.  He also noted a significant amount of unintentional weight loss over the same timeframe.  Patient subsequently underwent surgery for possible hemorrhoidectomy, but was noted to have a large rectal mass.  Subsequent biopsy was consistent with malignancy.  Further imaging revealed widespread disease.  Patient has persistent pain.  His performance status is declining.  He has no neurologic complaints.  He denies any recent fevers or illnesses.  He has a poor appetite and weight loss.  He denies any chest pain, shortness of breath, cough, or hemoptysis.  He denies any nausea, vomiting, constipation, or diarrhea.  He does not endorse any melena or hematochezia.  He has no urinary complaints.  Patient feels generally terrible, but offers no further specific complaints today.    REVIEW OF SYSTEMS:   Review of Systems  Constitutional: Positive for malaise/fatigue and weight loss. Negative for fever.  Respiratory: Negative.  Negative for cough, hemoptysis and shortness of breath.   Cardiovascular: Negative.  Negative for chest pain and leg swelling.  Gastrointestinal: Negative for abdominal pain, blood in stool, constipation and melena.       Rectal pain  Genitourinary: Negative.  Negative for dysuria.  Musculoskeletal: Negative.  Negative for back pain.  Skin: Negative.  Negative for rash.  Neurological: Positive for weakness. Negative for dizziness, focal weakness and headaches.  Psychiatric/Behavioral: Negative.  The patient is not  nervous/anxious.     As per HPI. Otherwise, a complete review of systems is negative.  PAST MEDICAL HISTORY: Past Medical History:  Diagnosis Date  . Hypertension     PAST SURGICAL HISTORY: Past Surgical History:  Procedure Laterality Date  . HEMORRHOID SURGERY N/A 12/21/2019   Procedure: HEMORRHOIDECTOMY;  Surgeon: Jules Husbands, MD;  Location: [facility] ORS;  Service: General;  Laterality: N/A;  . NO PAST SURGERIES    . RECTAL BIOPSY  12/21/2019   Procedure: BIOPSY RECTAL;  Surgeon: Jules Husbands, MD;  Location: [facility] ORS;  Service: General;;    FAMILY HISTORY: Family History  Problem Relation Age of Onset  . Heart disease Mother     ADVANCED DIRECTIVES (Y/N):  N  HEALTH MAINTENANCE: Social History   Tobacco Use  . Smoking status: Former Smoker    Quit date: 09/2015    Years since quitting: 4.3  . Smokeless tobacco: Never Used  Substance Use Topics  . Alcohol use: No  . Drug use: Never     Colonoscopy:  PAP:  Bone density:  Lipid panel:  No Known Allergies  Current Outpatient Medications  Medication Sig Dispense Refill  . acetaminophen (TYLENOL) 500 MG tablet Take 1,000 mg by mouth every 6 (six) hours as needed for moderate pain or headache.    Marland Kitchen amLODipine (NORVASC) 5 MG tablet Take 1 tablet (5 mg total) by mouth daily. 90 tablet 0  . dexamethasone (DECADRON) 4 MG tablet Take 1 tablet (4 mg total) by mouth daily. 30 tablet 2  . docusate sodium (COLACE) 100 MG capsule Take 1 capsule (100 mg total) by mouth 2 (two) times daily. 20 capsule 0  . gabapentin (  NEURONTIN) 300 MG capsule Take 1 capsule (300 mg total) by mouth 3 (three) times daily. 30 capsule 0  . hydrocortisone (ANUSOL-HC) 25 MG suppository Place 1 suppository (25 mg total) rectally every 12 (twelve) hours. 12 suppository 1  . lidocaine (XYLOCAINE) 5 % ointment Apply 1 application topically 3 (three) times daily as needed. 35.44 g 1  . lidocaine-prilocaine (EMLA) cream Apply 1 application topically as  needed. 30 g 0  . Nitroglycerin 0.4 % OINT Apply every 12 hours 30 g 3  . Oxycodone HCl 10 MG TABS Take 1 tablet (10 mg total) by mouth every 6 (six) hours as needed. 90 tablet 0   No current facility-administered medications for this visit.    OBJECTIVE: Vitals:   01/04/20 1036  BP: 135/84  Pulse: (!) 110  Temp: 99.2 F (37.3 C)  SpO2: 97%     Body mass index is 24.24 kg/m.    ECOG FS:3 - Symptomatic, >50% confined to bed  General: Thin, mild discomfort secondary to pain.  Sitting in a wheelchair. Eyes: Pink conjunctiva, anicteric sclera. HEENT: Normocephalic, moist mucous membranes. Lungs: No audible wheezing or coughing. Heart: Regular rate and rhythm. Abdomen: Soft, nontender, no obvious distention. Musculoskeletal: No edema, cyanosis, or clubbing. Neuro: Alert, answering all questions appropriately. Cranial nerves grossly intact. Skin: No rashes or petechiae noted. Psych: Normal affect. Lymphatics: No cervical, calvicular, axillary or inguinal LAD.   LAB RESULTS:  Lab Results  Component Value Date   NA 129 (L) 01/04/2020   K 4.4 01/04/2020   CL 93 (L) 01/04/2020   CO2 21 (L) 01/04/2020   GLUCOSE 128 (H) 01/04/2020   BUN 31 (H) 01/04/2020   CREATININE 1.53 (H) 01/04/2020   CALCIUM 8.6 (L) 01/04/2020   PROT 7.5 01/04/2020   ALBUMIN 2.5 (L) 01/04/2020   AST 399 (H) 01/04/2020   ALT 84 (H) 01/04/2020   ALKPHOS 520 (H) 01/04/2020   BILITOT 2.1 (H) 01/04/2020   GFRNONAA 46 (L) 01/04/2020   GFRAA 53 (L) 01/04/2020    Lab Results  Component Value Date   WBC 9.2 01/04/2020   NEUTROABS 7.5 01/04/2020   HGB 10.2 (L) 01/04/2020   HCT 31.3 (L) 01/04/2020   MCV 89.2 01/04/2020   PLT 605 (H) 01/04/2020     STUDIES: CT Chest W Contrast  Result Date: 12/28/2019 CLINICAL DATA:  Staging rectal carcinoma. EXAM: CT CHEST, ABDOMEN, AND PELVIS WITH CONTRAST TECHNIQUE: Multidetector CT imaging of the chest, abdomen and pelvis was performed following the standard  protocol during bolus administration of intravenous contrast. CONTRAST:  113mL OMNIPAQUE IOHEXOL 300 MG/ML  SOLN COMPARISON:  CT abdomen pelvis 09/19/2016 FINDINGS: CT CHEST FINDINGS Cardiovascular: Normal heart size. Trace fluid superior pericardial recess. Thoracic aortic vascular calcifications. Mediastinum/Nodes: No enlarged axillary, mediastinal or hilar lymphadenopathy. Normal appearance of the esophagus. Lungs/Pleura: Central airways are patent. Bandlike atelectasis within the bilateral lower lobes. Multiple bilateral pulmonary nodules are demonstrated. Reference 5 mm subpleural left lower lobe nodule (image 107; series 4). Reference 7 mm right lower lobe nodule (image 99; series 4). No pleural effusion or pneumothorax. Musculoskeletal: Thoracic spine degenerative changes. No aggressive or acute appearing osseous lesions. CT ABDOMEN PELVIS FINDINGS Hepatobiliary: The liver is enlarged. There are innumerable low-attenuation lesions throughout the liver compatible with hepatic metastasis. Reference lesion within the posterior right hepatic lobe measures 3.1 x 2.5 cm (image 47; series 2). Reference lesion within the left hepatic lobe measures 3.9 x 2.5 cm (image 48; series 2). Gallbladder is unremarkable. No intrahepatic or  extrahepatic biliary ductal dilatation. Pancreas: Unremarkable Spleen: Unremarkable Adrenals/Urinary Tract: Normal adrenal glands. Kidneys enhance symmetrically with contrast. Small cyst superior pole left kidney. Urinary bladder is unremarkable. Stomach/Bowel: Oral contrast material throughout the small and large bowel. There is irregular masslike thickening involving lateral aspect of the rectum measuring up to 3.6 x 2.9 cm (image 112; series 2). No evidence for small bowel obstruction. Normal morphology of the stomach. No free fluid or free intraperitoneal air. Vascular/Lymphatic: Normal caliber abdominal aorta. Peripheral calcified atherosclerotic plaque. There is a 1.2 cm left hemi  pelvic node adjacent to the rectum (image 108; series 2). There is a 1.3 cm left hemi pelvic lymph node (image 103; series 2). There is an abnormal appearing 0.8 cm left hemi pelvic node (image 96; series 2). Reproductive: Heterogeneous prostate with dystrophic calcifications. Other: Small bilateral fat containing hernias. Musculoskeletal: Lumbar spine degenerative changes. No aggressive or acute appearing osseous lesions. IMPRESSION: 1. Irregular masslike thickening involving the lateral aspect of the rectum compatible with primary rectal carcinoma. 2. Innumerable low-attenuation lesions throughout the liver compatible with hepatic metastasis. 3. Enlarged left hemi pelvic lymph nodes compatible with metastatic disease. 4. Multiple bilateral pulmonary nodules concerning for pulmonary metastatic disease. 5. Aortic Atherosclerosis (ICD10-I70.0). Electronically Signed   By: Lovey Newcomer M.D.   On: 12/28/2019 15:49   CT ABDOMEN PELVIS W CONTRAST  Result Date: 12/28/2019 CLINICAL DATA:  Staging rectal carcinoma. EXAM: CT CHEST, ABDOMEN, AND PELVIS WITH CONTRAST TECHNIQUE: Multidetector CT imaging of the chest, abdomen and pelvis was performed following the standard protocol during bolus administration of intravenous contrast. CONTRAST:  120mL OMNIPAQUE IOHEXOL 300 MG/ML  SOLN COMPARISON:  CT abdomen pelvis 09/19/2016 FINDINGS: CT CHEST FINDINGS Cardiovascular: Normal heart size. Trace fluid superior pericardial recess. Thoracic aortic vascular calcifications. Mediastinum/Nodes: No enlarged axillary, mediastinal or hilar lymphadenopathy. Normal appearance of the esophagus. Lungs/Pleura: Central airways are patent. Bandlike atelectasis within the bilateral lower lobes. Multiple bilateral pulmonary nodules are demonstrated. Reference 5 mm subpleural left lower lobe nodule (image 107; series 4). Reference 7 mm right lower lobe nodule (image 99; series 4). No pleural effusion or pneumothorax. Musculoskeletal: Thoracic  spine degenerative changes. No aggressive or acute appearing osseous lesions. CT ABDOMEN PELVIS FINDINGS Hepatobiliary: The liver is enlarged. There are innumerable low-attenuation lesions throughout the liver compatible with hepatic metastasis. Reference lesion within the posterior right hepatic lobe measures 3.1 x 2.5 cm (image 47; series 2). Reference lesion within the left hepatic lobe measures 3.9 x 2.5 cm (image 48; series 2). Gallbladder is unremarkable. No intrahepatic or extrahepatic biliary ductal dilatation. Pancreas: Unremarkable Spleen: Unremarkable Adrenals/Urinary Tract: Normal adrenal glands. Kidneys enhance symmetrically with contrast. Small cyst superior pole left kidney. Urinary bladder is unremarkable. Stomach/Bowel: Oral contrast material throughout the small and large bowel. There is irregular masslike thickening involving lateral aspect of the rectum measuring up to 3.6 x 2.9 cm (image 112; series 2). No evidence for small bowel obstruction. Normal morphology of the stomach. No free fluid or free intraperitoneal air. Vascular/Lymphatic: Normal caliber abdominal aorta. Peripheral calcified atherosclerotic plaque. There is a 1.2 cm left hemi pelvic node adjacent to the rectum (image 108; series 2). There is a 1.3 cm left hemi pelvic lymph node (image 103; series 2). There is an abnormal appearing 0.8 cm left hemi pelvic node (image 96; series 2). Reproductive: Heterogeneous prostate with dystrophic calcifications. Other: Small bilateral fat containing hernias. Musculoskeletal: Lumbar spine degenerative changes. No aggressive or acute appearing osseous lesions. IMPRESSION: 1. Irregular masslike thickening involving the  lateral aspect of the rectum compatible with primary rectal carcinoma. 2. Innumerable low-attenuation lesions throughout the liver compatible with hepatic metastasis. 3. Enlarged left hemi pelvic lymph nodes compatible with metastatic disease. 4. Multiple bilateral pulmonary  nodules concerning for pulmonary metastatic disease. 5. Aortic Atherosclerosis (ICD10-I70.0). Electronically Signed   By: Lovey Newcomer M.D.   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  PLAN:    1. 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  Case is also been discussed with surgery.  Given his declining performance status, worsening liver and renal function it is unlikely patient will be able to tolerate treatments.  He was evaluated by radiation oncology today and will benefit from palliative XRT for his rectal pain.  Return to clinic in 1 week for further evaluation, laboratory work, and to discuss whether treatment is even an option.  Patient will also have palliative care consult next week. 2.  Weight loss: Patient was given a prescription for dexamethasone 4 mg daily.  He has also been referred to dietary. 3.  Pain: XRT as above.  Patient was also given oxycodone 10 mg every 4-6 hours. 4.  Acute renal failure: Likely secondary to poor fluid intake and progression of disease. 5.  Worsening liver function: Patient's AST, ALT, and bilirubin are all trending up likely secondary to progression of disease.  Continue to monitor closely repeat laboratory work in 1 week.  I spent a total of 60 minutes reviewing chart data, face-to-face evaluation with the patient, counseling and coordination of care as detailed above.    Patient expressed understanding and was in agreement with this plan. He also understands that He can call clinic at any time with any questions, concerns, or complaints.   Cancer Staging Rectal adenocarcinoma [facility]) Staging form: Colon and Rectum, AJCC 8th Edition - Clinical stage from 01/06/2020: Stage IVB (cTX, cNX, pM1b) - Signed by Lloyd Huger, MD on 01/06/2020   Lloyd Huger, MD   01/06/2020 7:15 AM

## 2020-01-07 ENCOUNTER — Other Ambulatory Visit: Payer: Self-pay

## 2020-01-07 ENCOUNTER — Encounter: Admission: RE | Payer: Self-pay | Source: Home / Self Care

## 2020-01-07 ENCOUNTER — Ambulatory Visit: Admission: RE | Admit: 2020-01-07 | Payer: Medicare Other | Source: Home / Self Care | Admitting: Gastroenterology

## 2020-01-07 SURGERY — COLONOSCOPY WITH PROPOFOL
Anesthesia: General

## 2020-01-08 ENCOUNTER — Ambulatory Visit: Payer: Medicare Other

## 2020-01-08 ENCOUNTER — Emergency Department
Admission: EM | Admit: 2020-01-08 | Discharge: 2020-01-08 | Disposition: A | Discharge status: Hospice | Payer: Medicare Other | Attending: Emergency Medicine | Admitting: Emergency Medicine

## 2020-01-08 ENCOUNTER — Telehealth: Payer: Self-pay

## 2020-01-08 ENCOUNTER — Encounter: Payer: Self-pay | Admitting: Emergency Medicine

## 2020-01-08 ENCOUNTER — Other Ambulatory Visit: Payer: Self-pay

## 2020-01-08 ENCOUNTER — Emergency Department: Payer: Medicare Other

## 2020-01-08 DIAGNOSIS — R112 Nausea with vomiting, unspecified: Secondary | ICD-10-CM | POA: Diagnosis not present

## 2020-01-08 DIAGNOSIS — I10 Essential (primary) hypertension: Secondary | ICD-10-CM | POA: Diagnosis not present

## 2020-01-08 DIAGNOSIS — C799 Secondary malignant neoplasm of unspecified site: Secondary | ICD-10-CM | POA: Insufficient documentation

## 2020-01-08 DIAGNOSIS — K6289 Other specified diseases of anus and rectum: Secondary | ICD-10-CM | POA: Diagnosis present

## 2020-01-08 DIAGNOSIS — Z87891 Personal history of nicotine dependence: Secondary | ICD-10-CM | POA: Diagnosis not present

## 2020-01-08 DIAGNOSIS — Z85038 Personal history of other malignant neoplasm of large intestine: Secondary | ICD-10-CM | POA: Diagnosis not present

## 2020-01-08 HISTORY — DX: Malignant (primary) neoplasm, unspecified: C80.1

## 2020-01-08 LAB — COMPREHENSIVE METABOLIC PANEL
ALT: 61 U/L — ABNORMAL HIGH (ref 0–44)
AST: 117 U/L — ABNORMAL HIGH (ref 15–41)
Albumin: 2.4 g/dL — ABNORMAL LOW (ref 3.5–5.0)
Alkaline Phosphatase: 419 U/L — ABNORMAL HIGH (ref 38–126)
Anion gap: 16 — ABNORMAL HIGH (ref 5–15)
BUN: 33 mg/dL — ABNORMAL HIGH (ref 8–23)
CO2: 25 mmol/L (ref 22–32)
Calcium: 8.9 mg/dL (ref 8.9–10.3)
Chloride: 91 mmol/L — ABNORMAL LOW (ref 98–111)
Creatinine, Ser: 0.96 mg/dL (ref 0.61–1.24)
GFR calc Af Amer: >60 mL/min (ref >60)
GFR calc non Af Amer: >60 mL/min (ref >60)
Glucose, Bld: 107 mg/dL — ABNORMAL HIGH (ref 70–99)
Potassium: 4.5 mmol/L (ref 3.5–5.1)
Sodium: 132 mmol/L — ABNORMAL LOW (ref 135–145)
Total Bilirubin: 1.8 mg/dL — ABNORMAL HIGH (ref 0.3–1.2)
Total Protein: 7.4 g/dL (ref 6.5–8.1)

## 2020-01-08 LAB — URINALYSIS, COMPLETE (UACMP) WITH MICROSCOPIC
Bacteria, UA: NONE SEEN
Bilirubin Urine: NEGATIVE
Glucose, UA: NEGATIVE mg/dL
Ketones, ur: NEGATIVE mg/dL
Leukocytes,Ua: NEGATIVE
Nitrite: NEGATIVE
Protein, ur: 30 mg/dL — AB
Specific Gravity, Urine: 1.038 — ABNORMAL HIGH (ref 1.005–1.030)
Squamous Epithelial / HPF: NONE SEEN (ref 0–5)
pH: 5 (ref 5.0–8.0)

## 2020-01-08 LAB — CBC
HCT: 34.5 % — ABNORMAL LOW (ref 39.0–52.0)
Hemoglobin: 11.8 g/dL — ABNORMAL LOW (ref 13.0–17.0)
MCH: 29.8 pg (ref 26.0–34.0)
MCHC: 34.2 g/dL (ref 30.0–36.0)
MCV: 87.1 fL (ref 80.0–100.0)
Platelets: 556 10*3/uL — ABNORMAL HIGH (ref 150–400)
RBC: 3.96 MIL/uL — ABNORMAL LOW (ref 4.22–5.81)
RDW: 14.9 % (ref 11.5–15.5)
WBC: 10 10*3/uL (ref 4.0–10.5)
nRBC: 0.3 % — ABNORMAL HIGH (ref 0.0–0.2)

## 2020-01-08 LAB — LIPASE, BLOOD: Lipase: 13 U/L (ref 11–51)

## 2020-01-08 MED ORDER — HYDROMORPHONE HCL 1 MG/ML IJ SOLN: 1.0000 mg | Freq: Once | INTRAMUSCULAR | Status: AC

## 2020-01-08 MED ORDER — HYDROMORPHONE HCL 1 MG/ML IJ SOLN
INTRAMUSCULAR | Status: AC
  Administered 2020-01-08: 1 mg via INTRAVENOUS
  Filled 2020-01-08: qty 1

## 2020-01-08 MED ORDER — HYDROMORPHONE HCL 1 MG/ML IJ SOLN
1.0000 mg | Freq: Once | INTRAMUSCULAR | Status: AC
  Administered 2020-01-08: 1 mg via INTRAVENOUS
  Filled 2020-01-08: qty 1

## 2020-01-08 MED ORDER — IOHEXOL 300 MG/ML  SOLN
100.0000 mL | Freq: Once | INTRAMUSCULAR | Status: AC | PRN
  Administered 2020-01-08: 100 mL via INTRAVENOUS

## 2020-01-08 MED ORDER — CHLORPROMAZINE HCL 25 MG PO TABS
25.0000 mg | ORAL_TABLET | Freq: Once | ORAL | Status: AC
  Administered 2020-01-08: 25 mg via ORAL
  Filled 2020-01-08 (×2): qty 1

## 2020-01-08 MED ORDER — ONDANSETRON HCL 4 MG/2ML IJ SOLN
4.0000 mg | Freq: Once | INTRAMUSCULAR | Status: AC
  Administered 2020-01-08: 4 mg via INTRAVENOUS
  Filled 2020-01-08: qty 2

## 2020-01-08 NOTE — ED Notes (Signed)
[facility] CALLED  FOR  TRANSPORT  TO  HOSPICE  HOUSE

## 2020-01-08 NOTE — Consult Note (Addendum)
[facility]  Telephone:([phone] [phone]   Name: Willie Rivas Date: 01/08/2020 MRN: [id number]  DOB: 20-Jun-1951  Patient Care Team: Doreen Beam, FNP as PCP - General (Family Medicine) Clent Jacks, RN as Oncology Nurse Navigator    REASON FOR CONSULTATION: Willie Rivas is a 69 y.o. 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  Patient underwent hemorrhoidectomy surgery and was found to have a large rectal mass with subsequent pathology confirming malignancy.  Patient has had rapidly declining performance status with worsening pain.  He was felt not to be a candidate for systemic chemotherapy and was referred for palliative radiation to help manage the pain.  Unfortunately, patient presented to the ER on 01/08/2020 with worsening pain, weakness, and nausea.  Palliative care was consulted to help address goals and manage ongoing symptoms.  SOCIAL HISTORY:     reports that he quit smoking about 4 years ago. He has never used smokeless tobacco. He reports that he does not drink alcohol or use drugs.   Patient is divorced.  He lives at home alone.  However, he has an ex-wife in [place] who is involved in his care and speaks with him daily.  Patient has 3 sons and a daughter.  Several of his children are active in the [employer] and stationed outside of [place].  ADVANCE DIRECTIVES:  Not on file  CODE STATUS: DNR  PAST MEDICAL HISTORY: Past Medical History:  Diagnosis Date  . Cancer [facility])    Colon  . Hypertension     PAST SURGICAL HISTORY:  Past Surgical History:  Procedure Laterality Date  . HEMORRHOID SURGERY N/A 12/21/2019   Procedure: HEMORRHOIDECTOMY;  Surgeon: Jules Husbands, MD;  Location: [facility] ORS;  Service: General;  Laterality: N/A;  . NO PAST SURGERIES    . RECTAL BIOPSY  12/21/2019   Procedure: BIOPSY RECTAL;   Surgeon: Jules Husbands, MD;  Location: [facility] ORS;  Service: General;;    HEMATOLOGY/ONCOLOGY HISTORY:  Oncology History  Rectal adenocarcinoma ([place])  01/06/2020 Initial Diagnosis   Rectal adenocarcinoma ([place])   01/06/2020 Cancer Staging   Staging form: Colon and Rectum, AJCC 8th Edition - Clinical stage from 01/06/2020: Stage IVB (cTX, cNX, pM1b) - Signed by Lloyd Huger, MD on 01/06/2020     ALLERGIES:  has No Known Allergies.  MEDICATIONS:  No current facility-administered medications for this encounter.   Current Outpatient Medications  Medication Sig Dispense Refill  . acetaminophen (TYLENOL) 500 MG tablet Take 1,000 mg by mouth every 6 (six) hours as needed for moderate pain or headache.    . dexamethasone (DECADRON) 4 MG tablet Take 1 tablet (4 mg total) by mouth daily. 30 tablet 2  . lidocaine (XYLOCAINE) 5 % ointment Apply 1 application topically 3 (three) times daily as needed. 35.44 g 1  . lidocaine-prilocaine (EMLA) cream Apply 1 application topically as needed. 30 g 0  . Oxycodone HCl 10 MG TABS Take 1 tablet (10 mg total) by mouth every 6 (six) hours as needed. 90 tablet 0  . amLODipine (NORVASC) 5 MG tablet Take 1 tablet (5 mg total) by mouth daily. 90 tablet 0    VITAL SIGNS: BP (!) 165/117   Pulse 81   Temp 97.8 F (36.6 C) (Axillary)   Resp 17   Ht 5\' 8"  (1.727 m)   Wt 145 lb (65.8 kg)   SpO2  98%   BMI 22.05 kg/m  Filed Weights   01/08/20 0916  Weight: 145 lb (65.8 kg)    Estimated body mass index is 22.05 kg/m as calculated from the following:   Height as of this encounter: 5\' 8"  (1.727 m).   Weight as of this encounter: 145 lb (65.8 kg).  LABS: CBC:    Component Value Date/Time   WBC 10.0 01/08/2020 0920   HGB 11.8 (L) 01/08/2020 0920   HGB 13.1 11/13/2019 0937   HCT 34.5 (L) 01/08/2020 0920   HCT 39.2 11/13/2019 0937   PLT 556 (H) 01/08/2020 0920   PLT 309 11/13/2019 0937   MCV 87.1 01/08/2020 0920   MCV 93 11/13/2019 0937   MCV 95  07/31/2013 0439   NEUTROABS 7.5 01/04/2020 1135   NEUTROABS 3.0 11/13/2019 0937   NEUTROABS 6.4 07/31/2013 0439   LYMPHSABS 0.9 01/04/2020 1135   LYMPHSABS 1.3 11/13/2019 0937   LYMPHSABS 0.8 (L) 07/31/2013 0439   MONOABS 0.7 01/04/2020 1135   MONOABS 0.1 (L) 07/31/2013 0439   EOSABS 0.0 01/04/2020 1135   EOSABS 0.2 11/13/2019 0937   EOSABS 0.0 07/31/2013 0439   BASOSABS 0.0 01/04/2020 1135   BASOSABS 0.1 11/13/2019 0937   BASOSABS 0.1 07/31/2013 0439   Comprehensive Metabolic Panel:    Component Value Date/Time   NA 132 (L) 01/08/2020 0920   NA 135 (L) 07/31/2013 0439   K 4.5 01/08/2020 0920   K 4.5 07/31/2013 0439   CL 91 (L) 01/08/2020 0920   CL 104 07/31/2013 0439   CO2 25 01/08/2020 0920   CO2 27 07/31/2013 0439   BUN 33 (H) 01/08/2020 0920   BUN 17 07/31/2013 0439   CREATININE 0.96 01/08/2020 0920   CREATININE 0.95 07/31/2013 0439   GLUCOSE 107 (H) 01/08/2020 0920   GLUCOSE 116 (H) 07/31/2013 0439   CALCIUM 8.9 01/08/2020 0920   CALCIUM 9.6 07/31/2013 0439   AST 117 (H) 01/08/2020 0920   AST 35 07/31/2013 0439   ALT 61 (H) 01/08/2020 0920   ALT 30 07/31/2013 0439   ALKPHOS 419 (H) 01/08/2020 0920   ALKPHOS 69 07/31/2013 0439   BILITOT 1.8 (H) 01/08/2020 0920   BILITOT 0.4 07/31/2013 0439   PROT 7.4 01/08/2020 0920   PROT 8.1 07/31/2013 0439   ALBUMIN 2.4 (L) 01/08/2020 0920   ALBUMIN 4.5 07/31/2013 0439    RADIOGRAPHIC STUDIES: CT Chest W Contrast  Result Date: 12/28/2019 CLINICAL DATA:  Staging rectal carcinoma. EXAM: CT CHEST, ABDOMEN, AND PELVIS WITH CONTRAST TECHNIQUE: Multidetector CT imaging of the chest, abdomen and pelvis was performed following the standard protocol during bolus administration of intravenous contrast. CONTRAST:  158mL OMNIPAQUE IOHEXOL 300 MG/ML  SOLN COMPARISON:  CT abdomen pelvis 09/19/2016 FINDINGS: CT CHEST FINDINGS Cardiovascular: Normal heart size. Trace fluid superior pericardial recess. Thoracic aortic vascular  calcifications. Mediastinum/Nodes: No enlarged axillary, mediastinal or hilar lymphadenopathy. Normal appearance of the esophagus. Lungs/Pleura: Central airways are patent. Bandlike atelectasis within the bilateral lower lobes. Multiple bilateral pulmonary nodules are demonstrated. Reference 5 mm subpleural left lower lobe nodule (image 107; series 4). Reference 7 mm right lower lobe nodule (image 99; series 4). No pleural effusion or pneumothorax. Musculoskeletal: Thoracic spine degenerative changes. No aggressive or acute appearing osseous lesions. CT ABDOMEN PELVIS FINDINGS Hepatobiliary: The liver is enlarged. There are innumerable low-attenuation lesions throughout the liver compatible with hepatic metastasis. Reference lesion within the posterior right hepatic lobe measures 3.1 x 2.5 cm (image 47; series 2). Reference lesion within the  left hepatic lobe measures 3.9 x 2.5 cm (image 48; series 2). Gallbladder is unremarkable. No intrahepatic or extrahepatic biliary ductal dilatation. Pancreas: Unremarkable Spleen: Unremarkable Adrenals/Urinary Tract: Normal adrenal glands. Kidneys enhance symmetrically with contrast. Small cyst superior pole left kidney. Urinary bladder is unremarkable. Stomach/Bowel: Oral contrast material throughout the small and large bowel. There is irregular masslike thickening involving lateral aspect of the rectum measuring up to 3.6 x 2.9 cm (image 112; series 2). No evidence for small bowel obstruction. Normal morphology of the stomach. No free fluid or free intraperitoneal air. Vascular/Lymphatic: Normal caliber abdominal aorta. Peripheral calcified atherosclerotic plaque. There is a 1.2 cm left hemi pelvic node adjacent to the rectum (image 108; series 2). There is a 1.3 cm left hemi pelvic lymph node (image 103; series 2). There is an abnormal appearing 0.8 cm left hemi pelvic node (image 96; series 2). Reproductive: Heterogeneous prostate with dystrophic calcifications. Other:  Small bilateral fat containing hernias. Musculoskeletal: Lumbar spine degenerative changes. No aggressive or acute appearing osseous lesions. IMPRESSION: 1. Irregular masslike thickening involving the lateral aspect of the rectum compatible with primary rectal carcinoma. 2. Innumerable low-attenuation lesions throughout the liver compatible with hepatic metastasis. 3. Enlarged left hemi pelvic lymph nodes compatible with metastatic disease. 4. Multiple bilateral pulmonary nodules concerning for pulmonary metastatic disease. 5. Aortic Atherosclerosis (ICD10-I70.0). Electronically Signed   By: Lovey Newcomer M.D.   On: 12/28/2019 15:49   CT ABDOMEN PELVIS W CONTRAST  Result Date: 01/08/2020 CLINICAL DATA:  Acute generalized abdominal pain. Neutropenia. EXAM: CT ABDOMEN AND PELVIS WITH CONTRAST TECHNIQUE: Multidetector CT imaging of the abdomen and pelvis was performed using the standard protocol following bolus administration of intravenous contrast. CONTRAST:  181mL OMNIPAQUE IOHEXOL 300 MG/ML  SOLN COMPARISON:  December 28, 2019. FINDINGS: Lower chest: Several small nodules are noted in the visualized lung bases bilaterally suggesting metastatic disease. Hepatobiliary: No gallstones or biliary dilatation is noted. There are again noted multiple low density lesions throughout hepatic parenchyma consistent with diffuse hepatic metastases. Pancreas: Unremarkable. No pancreatic ductal dilatation or surrounding inflammatory changes. Spleen: Normal in size without focal abnormality. Adrenals/Urinary Tract: Adrenal glands appear normal. Left renal cyst is noted. No hydronephrosis or renal obstruction is noted. No renal or ureteral calculi are noted. Urinary bladder is unremarkable. Stomach/Bowel: The stomach appears normal. The appendix appears normal. There is no evidence of bowel obstruction or inflammation. There remains eccentric mass involving the rectum consistent with history of rectal carcinoma. Vascular/Lymphatic:  Aortic atherosclerosis. 1.2 cm lymph node is noted in the left pelvis concerning for metastatic disease. Probable 1.5 cm left internal iliac lymph node is noted concerning for metastatic disease. Reproductive: Mild prostatic enlargement is noted with associated calcifications. Other: No abdominal wall hernia or abnormality. Small amount of free fluid is noted in the pelvis. Musculoskeletal: No acute or significant osseous findings. IMPRESSION: 1. Multiple small nodules are noted in the visualized lung bases bilaterally concerning for metastatic disease. 2. Diffuse hepatic metastases are noted. 3. Aortic atherosclerosis. 4. Eccentric mass is seen involving the rectum consistent with history of rectal carcinoma. 5. Enlarged lymph nodes are noted in the left pelvis and left internal iliac region concerning for metastatic disease. 6. Small amount of free fluid is noted in the pelvis. Aortic Atherosclerosis (ICD10-I70.0). Electronically Signed   By: Marijo Conception M.D.   On: 01/08/2020 11:52   CT ABDOMEN PELVIS W CONTRAST  Result Date: 12/28/2019 CLINICAL DATA:  Staging rectal carcinoma. EXAM: CT CHEST, ABDOMEN, AND PELVIS WITH CONTRAST  TECHNIQUE: Multidetector CT imaging of the chest, abdomen and pelvis was performed following the standard protocol during bolus administration of intravenous contrast. CONTRAST:  129mL OMNIPAQUE IOHEXOL 300 MG/ML  SOLN COMPARISON:  CT abdomen pelvis 09/19/2016 FINDINGS: CT CHEST FINDINGS Cardiovascular: Normal heart size. Trace fluid superior pericardial recess. Thoracic aortic vascular calcifications. Mediastinum/Nodes: No enlarged axillary, mediastinal or hilar lymphadenopathy. Normal appearance of the esophagus. Lungs/Pleura: Central airways are patent. Bandlike atelectasis within the bilateral lower lobes. Multiple bilateral pulmonary nodules are demonstrated. Reference 5 mm subpleural left lower lobe nodule (image 107; series 4). Reference 7 mm right lower lobe nodule (image 99;  series 4). No pleural effusion or pneumothorax. Musculoskeletal: Thoracic spine degenerative changes. No aggressive or acute appearing osseous lesions. CT ABDOMEN PELVIS FINDINGS Hepatobiliary: The liver is enlarged. There are innumerable low-attenuation lesions throughout the liver compatible with hepatic metastasis. Reference lesion within the posterior right hepatic lobe measures 3.1 x 2.5 cm (image 47; series 2). Reference lesion within the left hepatic lobe measures 3.9 x 2.5 cm (image 48; series 2). Gallbladder is unremarkable. No intrahepatic or extrahepatic biliary ductal dilatation. Pancreas: Unremarkable Spleen: Unremarkable Adrenals/Urinary Tract: Normal adrenal glands. Kidneys enhance symmetrically with contrast. Small cyst superior pole left kidney. Urinary bladder is unremarkable. Stomach/Bowel: Oral contrast material throughout the small and large bowel. There is irregular masslike thickening involving lateral aspect of the rectum measuring up to 3.6 x 2.9 cm (image 112; series 2). No evidence for small bowel obstruction. Normal morphology of the stomach. No free fluid or free intraperitoneal air. Vascular/Lymphatic: Normal caliber abdominal aorta. Peripheral calcified atherosclerotic plaque. There is a 1.2 cm left hemi pelvic node adjacent to the rectum (image 108; series 2). There is a 1.3 cm left hemi pelvic lymph node (image 103; series 2). There is an abnormal appearing 0.8 cm left hemi pelvic node (image 96; series 2). Reproductive: Heterogeneous prostate with dystrophic calcifications. Other: Small bilateral fat containing hernias. Musculoskeletal: Lumbar spine degenerative changes. No aggressive or acute appearing osseous lesions. IMPRESSION: 1. Irregular masslike thickening involving the lateral aspect of the rectum compatible with primary rectal carcinoma. 2. Innumerable low-attenuation lesions throughout the liver compatible with hepatic metastasis. 3. Enlarged left hemi pelvic lymph nodes  compatible with metastatic disease. 4. Multiple bilateral pulmonary nodules concerning for pulmonary metastatic disease. 5. Aortic Atherosclerosis (ICD10-I70.0). Electronically Signed   By: Lovey Newcomer M.D.   On: 12/28/2019 15:49    PERFORMANCE STATUS (ECOG) : 4 - Bedbound  Review of Systems Unable to complete  Physical Exam General: Ill-appearing Cardiovascular: regular rate and rhythm Pulmonary: clear ant fields Abdomen: soft, nontender, + bowel sounds GU: no suprapubic tenderness Extremities: no edema, no joint deformities Skin: no rashes Neurological: Weakness, some confusion  IMPRESSION: Patient seen in the ER.  He is alert but has some intermittent confusion.  I question if he could have brain metastases given his widely metastatic disease.  I attempted a conversation regarding goals.  Patient says that he knows that he has "stage IV cancer" and when asked, patient said that means "I do not have long to live."    I presented the option of hospitalization vs hospice to patient and he verbalized agreement with hospice but I am not sure how much he understood.  Patient asked that I call his daughter for decision-making.  He does not appear to have a healthcare power of attorney on file.  Patient also says that his ex-wife is involved in his care.  I called and spoke with patient's daughter,  Willie Rivas, and ex-wife, Willie Rivas (Daughter can be reached at [phone]). Together, we reviewed patient's current medical problems.  Both verbalized an understanding that patient is likely nearing end-of-life secondary to progressive colorectal cancer.  They say that patient has been declining at home.  He has had minimal oral intake and progressive weakness.  They no longer feel that patient can care for himself at home.  They also stated patient's home is in a state of disrepair, is unsafe, and does not have heat.  Both patient's daughter and ex-wife verbalized an understanding that he is not felt to  be a candidate for systemic treatment.  Plan was for XRT but family do not feel that patient will be able to come into the clinic daily given his progressive decline.  We discussed the option of hospitalization versus hospice involvement.  Family said that they would prefer just to keep patient comfortable until his end-of-life.  They requested that patient be transferred to the Hospice Home.  I do feel that patient is clinically appropriate for inpatient hospice.  Patient's daughter says that he has told her that he would not want to be resuscitated or have his life prolonged artificially on machines.  She asked that patient be a DNR. I asked patient his wishes and he stated "just let me be in peace."  PLAN: -Comfort care -DNR/DNI -Transfer to the hospice home when a bed is available -Hospice liaison to help coordinate discharge from the ER -Agree with as needed analgesics/antiemetics  Case and plan discussed with Drs. Grayland Ormond and [place].   Patient expressed understanding and was in agreement with this plan. He also understands that He can call the clinic at any time with any questions, concerns, or complaints.     Time Total: 60 minutes  Visit consisted of counseling and education dealing with the complex and emotionally intense issues of symptom management and palliative care in the setting of serious and potentially life-threatening illness.Greater than 50%  of this time was spent counseling and coordinating care related to the above assessment and plan.  Signed by: Altha Harm, PhD, NP-C

## 2020-01-08 NOTE — Telephone Encounter (Signed)
Notified that Willie Rivas was sent to the ED after having N/V and uncontrollable pain when he presented for CT simulation of rectal cancer. He has since met with Josh, palliative care and has decided to go to the hospice home. Per Dr. Grayland Ormond we can cancel all appointments. Notified Dr. Baruch Gouty and they will cancel all appointments for radiation.

## 2020-01-08 NOTE — ED Notes (Signed)
Unable to print yellow lab label, sunquest reporting error: user has incorrect permission to access application. Specimen sent with chart label with date/time/initial added, lab notified. IT ticket placed.

## 2020-01-08 NOTE — ED Notes (Addendum)
[facility] present to transport pt to hospice. All belongings sent with patient to [facility] including Rx bottle of Dexamethasone and 2nd bottle of Oxycodone 10mg  tablets. Home medication count sheet filled out for oxycodone, counted by RN in front of pt. Both pt and RN signed form verifying count. Call placed to Hospice RN informing them of amount sent with patient [phone]). Pt discharged with IV in place.

## 2020-01-08 NOTE — ED Triage Notes (Signed)
Pt in via [facility], brought over from [facility], here today for outpatient CT scan, states they would not do scan due to patients rectal pain and ongoing emesis x 1 day.  NAD noted upon arrival.

## 2020-01-08 NOTE — Progress Notes (Signed)
[facility]uring [facility] hospital liaison note: New referral for [facility] hospice home received from [place] NP. TOC Andreas Newport and EDP physician Dr. Ellender Hose notified. Writer spoke via telephone to patient's daughter Tico Kiesling at Josh's request to initiate education regarding hospice services, philosophy, team approach to care and current visitation policy with understanding voiced. Bobbi to sign consents with hospice home social worker. Patient has been approved for admission by hospice Management consultant. Patient seen in the ED lying on the ED stretcher, he was alert but intermittently confused. He stated he did not want to go to the hospice home. After much discussion and several phone calls to different family members patient agreed to go to the hospice home tonight.  Report called to the Hospice home. ED secretary to contact EMS for transport. Patient did report 10/10 pain and had constant hiccups through out the visit. Staff RN Elmo Putt and EDP Dr. Ellender Hose updated.Patint will receive IV dilaudid 1 mg and thorazine oral for hiccups. Thank you for the opportunity to be involved in the care of this patient and his family. Flo Shanks BSN, RN, [place] [phone]

## 2020-01-08 NOTE — Care Management (Signed)
RN CM: Notified by Santiago Glad with [facility] patient is being discharged to [facility] today.

## 2020-01-08 NOTE — ED Provider Notes (Addendum)
[facility] Emergency Department Provider Note       Time seen: ----------------------------------------- 9:57 AM on 01/08/2020 -----------------------------------------   I have reviewed the triage vital signs and the nursing notes.  HISTORY   Chief Complaint Rectal Pain and Emesis    HPI Willie Rivas is a 69 y.o. male with a history of colon cancer, hypertension, rectal adenocarcinoma, small bowel obstruction who presents to the ED for rectal pain and ongoing vomiting.  Reportedly he has been sent from the cancer center after he initially had an outpatient CT scan ordered.  They could not do this because of his ongoing pain and vomiting.  Past Medical History:  Diagnosis Date  . Cancer [facility])    Colon  . Hypertension     Patient Active Problem List   Diagnosis Date Noted  . Rectal adenocarcinoma ([place]) 01/06/2020  . Goals of care, counseling/discussion 01/06/2020  . Acute appendicitis with peritoneal abscess   . Small bowel obstruction [facility])     Past Surgical History:  Procedure Laterality Date  . HEMORRHOID SURGERY N/A 12/21/2019   Procedure: HEMORRHOIDECTOMY;  Surgeon: Jules Husbands, MD;  Location: [facility] ORS;  Service: General;  Laterality: N/A;  . NO PAST SURGERIES    . RECTAL BIOPSY  12/21/2019   Procedure: BIOPSY RECTAL;  Surgeon: Jules Husbands, MD;  Location: [facility] ORS;  Service: General;;    Allergies Patient has no known allergies.  Social History Social History   Tobacco Use  . Smoking status: Former Smoker    Quit date: 09/2015    Years since quitting: 4.3  . Smokeless tobacco: Never Used  Substance Use Topics  . Alcohol use: No  . Drug use: Never    Review of Systems Constitutional: Negative for fever. Cardiovascular: Negative for chest pain. Respiratory: Negative for shortness of breath.  Positive for hiccups Gastrointestinal: Positive for abdominal pain, vomiting Rectal: Positive for rectal pain Musculoskeletal:  Negative for back pain. Skin: Negative for rash. Neurological: Negative for headaches, focal weakness or numbness.  All systems negative/normal/unremarkable except as stated in the HPI  ____________________________________________   PHYSICAL EXAM:  VITAL SIGNS: ED Triage Vitals  Enc Vitals Group     BP 01/08/20 0915 (!) 144/107     Pulse Rate 01/08/20 0915 93     Resp 01/08/20 0915 19     Temp 01/08/20 0915 97.8 F (36.6 C)     Temp Source 01/08/20 0915 Axillary     SpO2 01/08/20 0915 94 %     Weight 01/08/20 0916 145 lb (65.8 kg)     Height 01/08/20 0916 5\' 8"  (1.727 m)     Head Circumference --      Peak Flow --      Pain Score 01/08/20 0915 10     Pain Loc --      Pain Edu? --      Excl. in [place]? --     Constitutional: Alert and oriented.  Mild distress from pain, active hiccups Eyes: Conjunctivae are normal. Normal extraocular movements. Cardiovascular: Normal rate, regular rhythm. No murmurs, rubs, or gallops. Respiratory: Normal respiratory effort without tachypnea nor retractions. Breath sounds are clear and equal bilaterally. No wheezes/rales/rhonchi. Gastrointestinal: Nonfocal tenderness, hypoactive bowel sounds Musculoskeletal: Nontender with normal range of motion in extremities. No lower extremity tenderness nor edema. Neurologic:  Normal speech and language. No gross focal neurologic deficits are appreciated.  Skin:  Skin is warm, dry and intact. No rash noted. Psychiatric: Mood and affect  are normal. Speech and behavior are normal.  ____________________________________________  ED COURSE:  As part of my medical decision making, I reviewed the following data within the [place] History obtained from family if available, nursing notes, old chart and ekg, as well as notes from prior ED visits. Patient presented for abdominal pain with vomiting, we will assess with labs and imaging as indicated at this time.   Procedures  Willie Rivas was  evaluated in Emergency Department on 01/08/2020 for the symptoms described in the history of present illness. He was evaluated in the context of the global COVID-19 pandemic, which necessitated consideration that the patient might be at risk for infection with the SARS-CoV-2 virus that causes COVID-19. Institutional protocols and algorithms that pertain to the evaluation of patients at risk for COVID-19 are in a state of rapid change based on information released by regulatory bodies including the CDC and federal and state organizations. These policies and algorithms were followed during the patient's care in the ED.  ____________________________________________   LABS (pertinent positives/negatives)  Labs Reviewed  COMPREHENSIVE METABOLIC PANEL - Abnormal; Notable for the following components:      Result Value   Sodium 132 (*)    Chloride 91 (*)    Glucose, Bld 107 (*)    BUN 33 (*)    Albumin 2.4 (*)    AST 117 (*)    ALT 61 (*)    Alkaline Phosphatase 419 (*)    Total Bilirubin 1.8 (*)    Anion gap 16 (*)    All other components within normal limits  CBC - Abnormal; Notable for the following components:   RBC 3.96 (*)    Hemoglobin 11.8 (*)    HCT 34.5 (*)    Platelets 556 (*)    nRBC 0.3 (*)    All other components within normal limits  LIPASE, BLOOD  URINALYSIS, COMPLETE (UACMP) WITH MICROSCOPIC    RADIOLOGY Images were viewed by me  CT the abdomen pelvis with contrast IMPRESSION:  1. Multiple small nodules are noted in the visualized lung bases  bilaterally concerning for metastatic disease.  2. Diffuse hepatic metastases are noted.  3. Aortic atherosclerosis.  4. Eccentric mass is seen involving the rectum consistent with  history of rectal carcinoma.  5. Enlarged lymph nodes are noted in the left pelvis and left  internal iliac region concerning for metastatic disease.  6. Small amount of free fluid is noted in the pelvis.   ____________________________________________   DIFFERENTIAL DIAGNOSIS   Metastatic cancer, obstruction, dehydration, electrolyte abnormality  FINAL ASSESSMENT AND PLAN  Widespread metastasis, vomiting   Plan: The patient had presented for abdominal pain and vomiting. Patient's labs did not reveal any significant interval changes. Patient's imaging revealed widespread metastatic disease that is now noted in the lungs, diffusely in the liver and also noted in the rectal area.  Enlarged lymph nodes are noted secondary to same with free fluid in the pelvis.  I have discussed with oncology and palliative care.  We are attempting to place in the hospice home.   Laurence Aly, MD    Note: This note was generated in part or whole with voice recognition software. Voice recognition is usually quite accurate but there are transcription errors that can and very often do occur. I apologize for any typographical errors that were not detected and corrected.     Earleen Newport, MD 01/08/20 1237    Earleen Newport, MD 01/08/20 [phone]

## 2020-01-09 ENCOUNTER — Encounter: Payer: Medicare Other | Admitting: Surgery

## 2020-01-10 ENCOUNTER — Ambulatory Visit: Payer: Medicare Other

## 2020-01-10 ENCOUNTER — Ambulatory Visit: Payer: Medicare Other | Admitting: General Surgery

## 2020-01-10 ENCOUNTER — Encounter: Payer: Self-pay | Admitting: Oncology

## 2020-01-11 ENCOUNTER — Inpatient Hospital Stay: Payer: Medicare Other

## 2020-01-11 ENCOUNTER — Inpatient Hospital Stay: Payer: Medicare Other | Admitting: Oncology

## 2020-01-11 ENCOUNTER — Inpatient Hospital Stay: Payer: Medicare Other | Admitting: Hospice and Palliative Medicine

## 2020-01-14 ENCOUNTER — Ambulatory Visit: Payer: Medicare Other

## 2020-01-15 ENCOUNTER — Encounter: Payer: Self-pay | Admitting: Oncology

## 2020-01-15 ENCOUNTER — Ambulatory Visit: Payer: Medicare Other

## 2020-01-16 ENCOUNTER — Ambulatory Visit: Payer: Medicare Other

## 2020-01-17 ENCOUNTER — Ambulatory Visit: Payer: Medicare Other

## 2020-01-18 ENCOUNTER — Ambulatory Visit: Payer: Medicare Other

## 2020-01-21 ENCOUNTER — Ambulatory Visit: Payer: Medicare Other

## 2020-01-22 ENCOUNTER — Ambulatory Visit: Payer: Medicare Other

## 2020-01-23 ENCOUNTER — Ambulatory Visit: Payer: Medicare Other

## 2020-01-24 ENCOUNTER — Ambulatory Visit: Payer: Medicare Other

## 2020-01-25 ENCOUNTER — Ambulatory Visit: Payer: Medicare Other

## 2020-01-28 ENCOUNTER — Ambulatory Visit: Payer: Medicare Other

## 2020-01-29 ENCOUNTER — Ambulatory Visit: Payer: Medicare Other

## 2020-01-30 ENCOUNTER — Ambulatory Visit: Payer: Medicare Other

## 2020-01-31 DEATH — deceased

## 2021-03-20 IMAGING — CT CT ABD-PELV W/ CM
2 of 4 series · 10 of 36 positions shown, 15 images · IV contrast (omnipaque)
Comparison: CT abdomen pelvis 09/19/2016

CLINICAL DATA: Staging rectal carcinoma.

EXAM:
CT CHEST, ABDOMEN, AND PELVIS WITH CONTRAST
TECHNIQUE: Multidetector CT imaging of the chest, abdomen and pelvis was
performed following the standard protocol during bolus
administration of intravenous contrast.
CONTRAST:  100mL OMNIPAQUE IOHEXOL 300 MG/ML  SOLN

[Series 2: cap with · axial · 0.79mm/px · z∈[-1026,-461]mm · 9 of 135 slices shown, 13 images]
[im 11/135  soft-tissue]
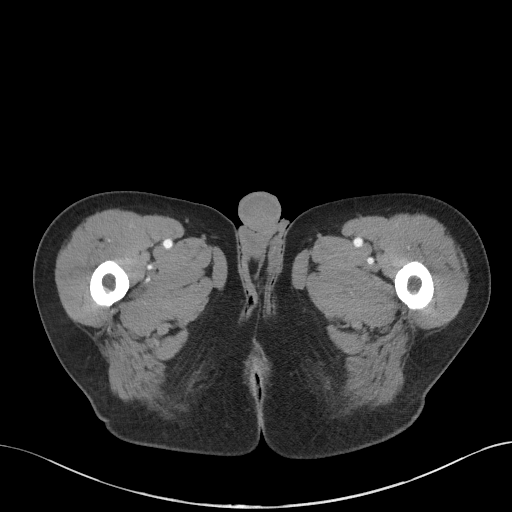
[im 11/135  bone]
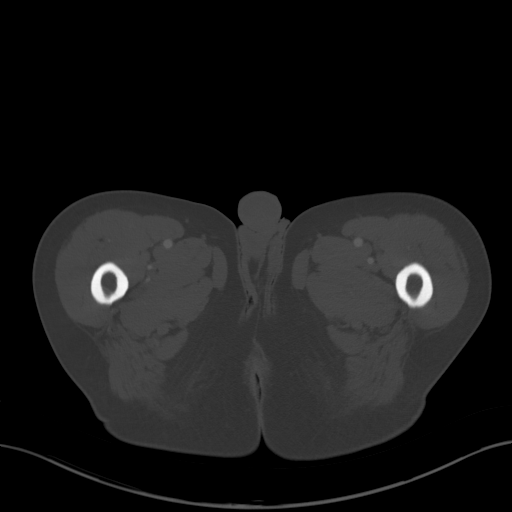
[im 31/135  soft-tissue]
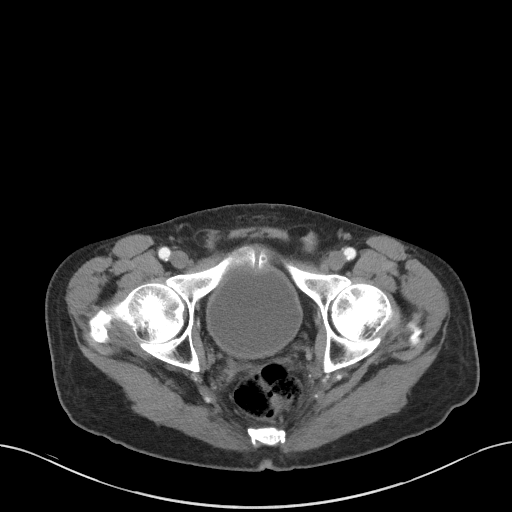
[im 42/135  soft-tissue]
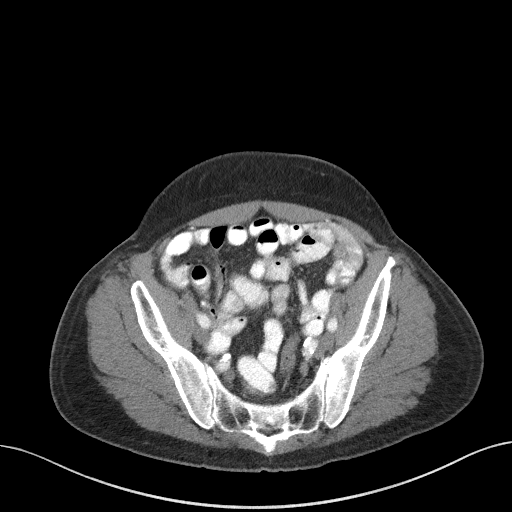
[im 62/135  soft-tissue]
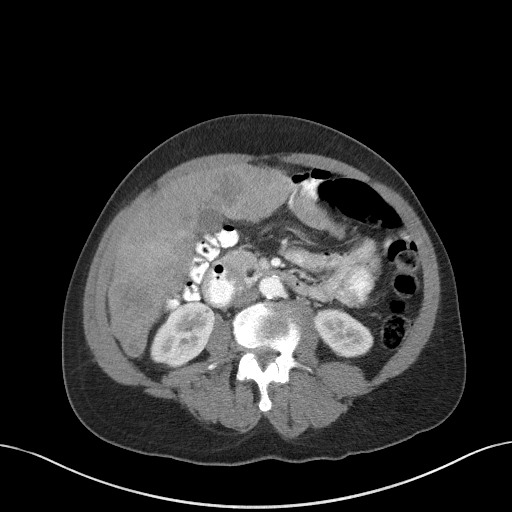
[im 73/135  soft-tissue]
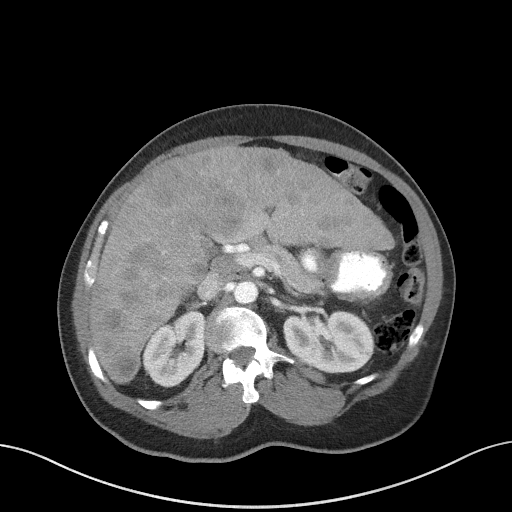
[im 93/135  soft-tissue]
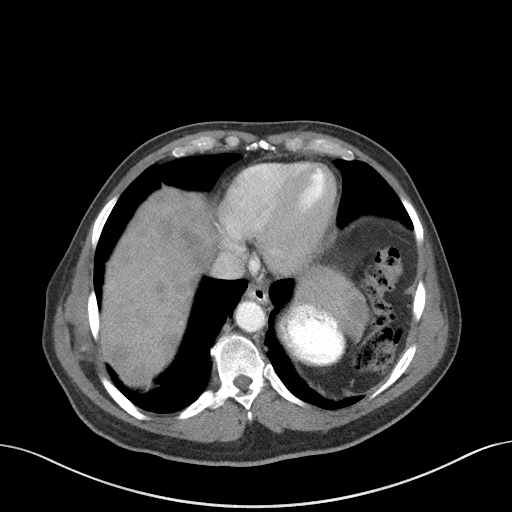
[im 93/135  lung]
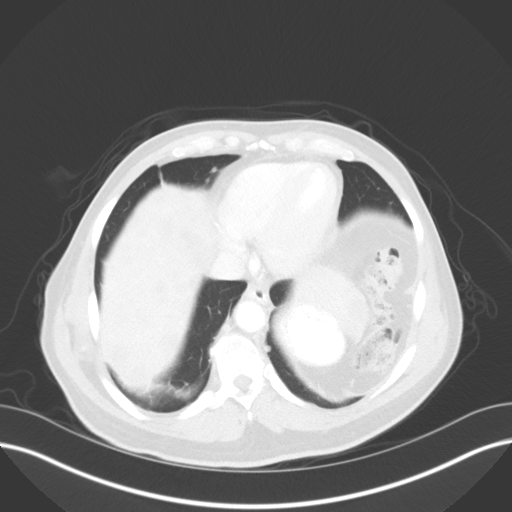
[im 104/135  soft-tissue]
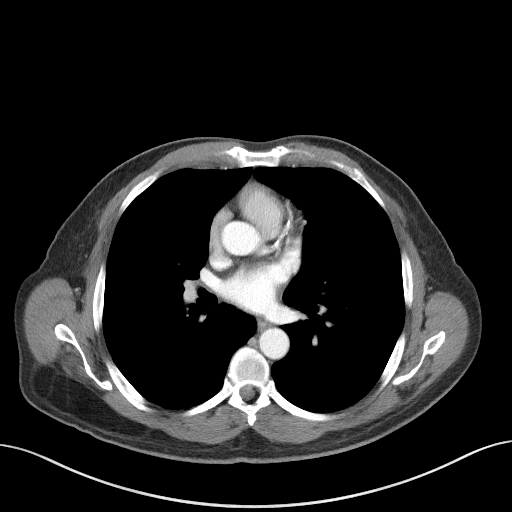
[im 104/135  lung]
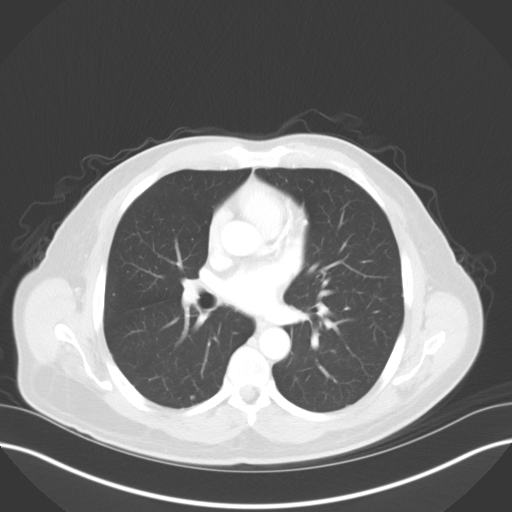
[im 114/135  lung]
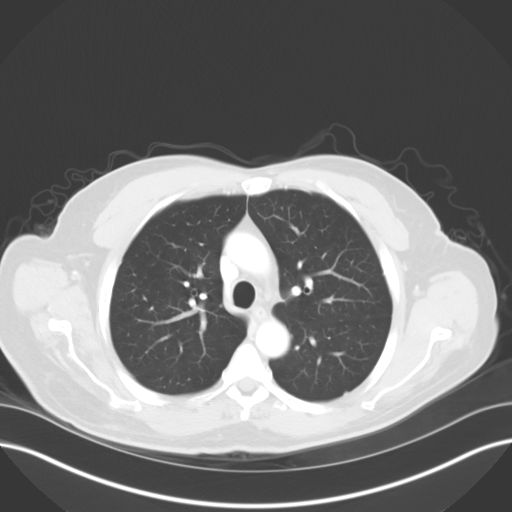
[im 124/135  soft-tissue]
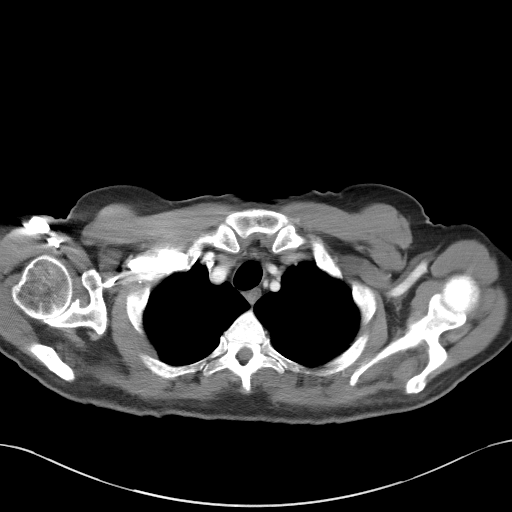
[im 124/135  lung]
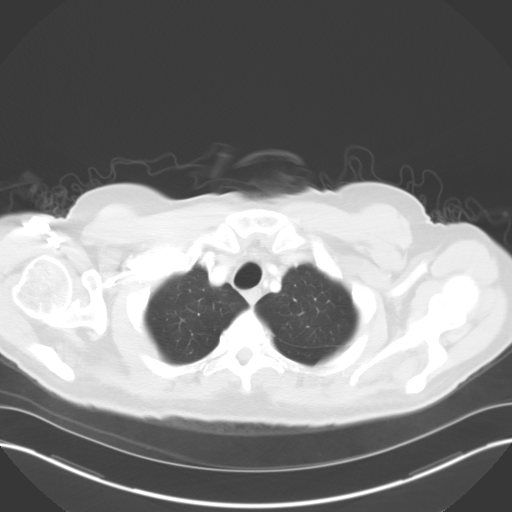

[Series 6: sagittals · sagittal · 0.62mm/px · 1 of 188 slices shown, 2 images]
[im 63/188  soft-tissue]
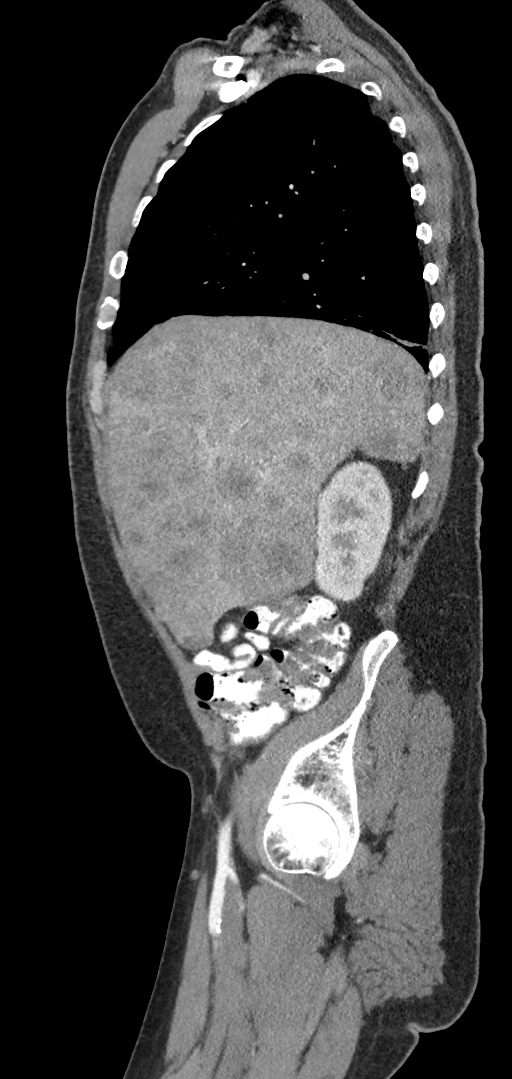
[im 63/188  bone]
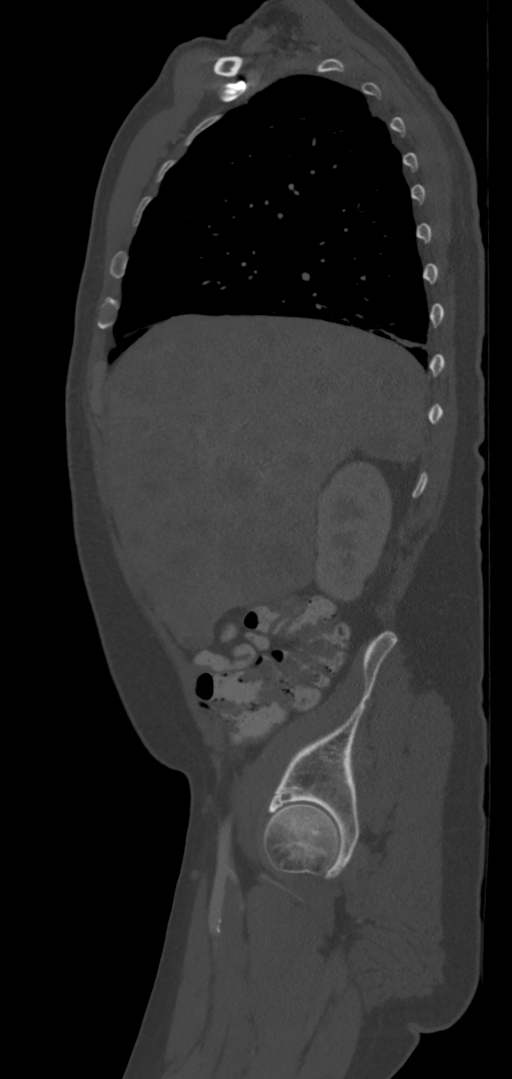

[10 of 36 positions shown; findings below may reference images not displayed]

FINDINGS: CT CHEST FINDINGS

Cardiovascular: Normal heart size. Trace fluid superior pericardial
recess. Thoracic aortic vascular calcifications.

Mediastinum/Nodes: No enlarged axillary, mediastinal or hilar
lymphadenopathy. Normal appearance of the esophagus.

Lungs/Pleura: Central airways are patent. Bandlike atelectasis
within the bilateral lower lobes. Multiple bilateral pulmonary
nodules are demonstrated. Reference 5 mm subpleural left lower lobe
nodule (image 107; series 4). Reference 7 mm right lower lobe nodule
(image 99; series 4). No pleural effusion or pneumothorax.

Musculoskeletal: Thoracic spine degenerative changes. No aggressive
or acute appearing osseous lesions.

CT ABDOMEN PELVIS FINDINGS

Hepatobiliary: The liver is enlarged. There are innumerable
low-attenuation lesions throughout the liver compatible with hepatic
metastasis. Reference lesion within the posterior right hepatic lobe
measures 3.1 x 2.5 cm (image 47; series 2). Reference lesion within
the left hepatic lobe measures 3.9 x 2.5 cm (image 48; series 2).
Gallbladder is unremarkable. No intrahepatic or extrahepatic biliary
ductal dilatation.

Pancreas: Unremarkable

Spleen: Unremarkable

Adrenals/Urinary Tract: Normal adrenal glands. Kidneys enhance
symmetrically with contrast. Small cyst superior pole left kidney.
Urinary bladder is unremarkable.

Stomach/Bowel: Oral contrast material throughout the small and large
bowel. There is irregular masslike thickening involving lateral
aspect of the rectum measuring up to 3.6 x 2.9 cm (image 112; series
2). No evidence for small bowel obstruction. Normal morphology of
the stomach. No free fluid or free intraperitoneal air.

Vascular/Lymphatic: Normal caliber abdominal aorta. Peripheral
calcified atherosclerotic plaque. There is a 1.2 cm left hemi pelvic
node adjacent to the rectum (image 108; series 2). There is a 1.3 cm
left hemi pelvic lymph node (image 103; series 2). There is an
abnormal appearing 0.8 cm left hemi pelvic node (image 96; series
2).

Reproductive: Heterogeneous prostate with dystrophic calcifications.

Other: Small bilateral fat containing hernias.

Musculoskeletal: Lumbar spine degenerative changes. No aggressive or
acute appearing osseous lesions.
IMPRESSION: 1. Irregular masslike thickening involving the lateral aspect of the
rectum compatible with primary rectal carcinoma.
2. Innumerable low-attenuation lesions throughout the liver
compatible with hepatic metastasis.
3. Enlarged left hemi pelvic lymph nodes compatible with metastatic
disease.
4. Multiple bilateral pulmonary nodules concerning for pulmonary
metastatic disease.
5. Aortic Atherosclerosis ([id number]).
